# Patient Record
Sex: Male | Born: 1980 | Race: Black or African American | Hispanic: No | Marital: Single | Smoking: Never smoker
Health system: Southern US, Community
[De-identification: ages and names within clinical notes are randomized; demographics above are authoritative.]

## PROBLEM LIST (undated history)

## (undated) DIAGNOSIS — I10 Essential (primary) hypertension: Secondary | ICD-10-CM

## (undated) DIAGNOSIS — E119 Type 2 diabetes mellitus without complications: Secondary | ICD-10-CM

---

## 2016-03-14 ENCOUNTER — Ambulatory Visit (HOSPITAL_COMMUNITY)
Admission: EM | Admit: 2016-03-14 | Discharge: 2016-03-14 | Disposition: A | Discharge status: Home / Self Care | Attending: Family Medicine | Admitting: Family Medicine

## 2016-03-14 ENCOUNTER — Encounter (HOSPITAL_COMMUNITY): Payer: Self-pay | Admitting: Family Medicine

## 2016-03-14 DIAGNOSIS — K529 Noninfective gastroenteritis and colitis, unspecified: Secondary | ICD-10-CM

## 2016-03-14 HISTORY — DX: Type 2 diabetes mellitus without complications: E11.9

## 2016-03-14 HISTORY — DX: Essential (primary) hypertension: I10

## 2016-03-14 MED ORDER — ONDANSETRON 8 MG PO TBDP: 8.0000 mg | ORAL_TABLET | Freq: Three times a day (TID) | ORAL | 0 refills | Status: DC | PRN

## 2016-03-14 NOTE — ED Triage Notes (Signed)
Pt here for flu like symptoms.  

## 2016-03-14 NOTE — ED Provider Notes (Addendum)
[facility]    CSN: [id number] Arrival date & time: 03/14/16  1415     History   Chief Complaint Chief Complaint  Patient presents with  . Cough  . Emesis  . Diarrhea    HPI Alex Gamble is a 36 y.o. male.   This is a 36 year old noncompliant diabetic man who presents with 3-4 days of nausea, vomiting, diarrhea, and cough. He has not taken any medicine today.  Patient is supposed be taking metformin but has not been doing so and has not been checking his sugars. No polyuria, polyphagia. He just has generalized weakness and fatigue. His last vomiting episode was late morning and he did have one diarrhea episode this morning as well.  He works as an Musician. He seen today with his significant other.      Past Medical History:  Diagnosis Date  . Diabetes mellitus without complication (HCC)   . Hypertension     There are no active problems to display for this patient.   History reviewed. No pertinent surgical history.     Home Medications    Prior to Admission medications   Not on File    Family History History reviewed. No pertinent family history.  Social History Social History  Substance Use Topics  . Smoking status: Never Smoker  . Smokeless tobacco: Never Used  . Alcohol use Not on file     Allergies   Hepatitis b virus vaccine   Review of Systems Review of Systems  Constitutional: Positive for appetite change and fatigue. Negative for fever.  HENT: Negative for ear pain.   Eyes: Negative for pain.  Respiratory: Positive for cough.   Gastrointestinal: Positive for diarrhea, nausea and vomiting.  Musculoskeletal: Negative.   Neurological: Negative for dizziness.     Physical Exam Triage Vital Signs ED Triage Vitals  Enc Vitals Group     BP 03/14/16 1439 135/86     Pulse Rate 03/14/16 1439 91     Resp 03/14/16 1439 18     Temp 03/14/16 1439 98.4 F (36.9 C)     Temp src --      SpO2 03/14/16 1439 96 %   Weight --      Height --      Head Circumference --      Peak Flow --      Pain Score 03/14/16 1438 10     Pain Loc --      Pain Edu? --      Excl. in GC? --    No data found.   Updated Vital Signs BP 135/86   Pulse 91   Temp 98.4 F (36.9 C)   Resp 18   SpO2 96%    Physical Exam  Constitutional: He is oriented to person, place, and time. He appears well-developed and well-nourished.  HENT:  Head: Normocephalic.  Right Ear: External ear normal.  Left Ear: External ear normal.  Mouth/Throat: Oropharynx is clear and moist.  Eyes: Conjunctivae are normal. Pupils are equal, round, and reactive to light.  Neck: Normal range of motion. Neck supple.  Pulmonary/Chest: Effort normal.  Abdominal: Soft. Bowel sounds are normal.  Musculoskeletal: Normal range of motion.  Neurological: He is alert and oriented to person, place, and time.  Skin: Skin is warm and dry.  Nursing note and vitals reviewed.    UC Treatments / Results  Labs (all labs ordered are listed, but only abnormal results are displayed) Labs Reviewed - No data to  display  EKG  EKG Interpretation None       Radiology No results found.  Procedures Procedures (including critical care time)  Medications Ordered in UC Medications - No data to display   Initial Impression / Assessment and Plan / UC Course  I have reviewed the triage vital signs and the nursing notes.  Pertinent labs & imaging results that were available during my care of the patient were reviewed by me and considered in my medical decision making (see chart for details).     Final Clinical Impressions(s) / UC Diagnoses   Final diagnoses:  None    New Prescriptions New Prescriptions   No medications on file     Elvina SidleKurt Sophiagrace Benbrook, MD 03/14/16 1500    Elvina SidleKurt Montavis Schubring, MD 03/14/16 [phone]

## 2017-01-04 ENCOUNTER — Ambulatory Visit (HOSPITAL_COMMUNITY)
Admission: EM | Admit: 2017-01-04 | Discharge: 2017-01-04 | Disposition: A | Discharge status: Home / Self Care | Attending: Family Medicine | Admitting: Family Medicine

## 2017-01-04 ENCOUNTER — Encounter (HOSPITAL_COMMUNITY): Payer: Self-pay | Admitting: Emergency Medicine

## 2017-01-04 ENCOUNTER — Other Ambulatory Visit: Payer: Self-pay

## 2017-01-04 ENCOUNTER — Ambulatory Visit (INDEPENDENT_AMBULATORY_CARE_PROVIDER_SITE_OTHER)

## 2017-01-04 DIAGNOSIS — R0789 Other chest pain: Secondary | ICD-10-CM

## 2017-01-04 MED ORDER — PREDNISONE 20 MG PO TABS: ORAL_TABLET | ORAL | 0 refills | Status: AC

## 2017-01-04 NOTE — ED Triage Notes (Signed)
Pt c/o CP middle of his chest down to the right, pain is reproducible with movement. Tender to palpation. Denies injury.

## 2017-01-04 NOTE — ED Provider Notes (Signed)
[facility]   [id number] 01/04/17 Arrival Time: 1001   SUBJECTIVE:  Alex Gamble is a 36 y.o. male who presents to the urgent care with complaint of right chest pain, spontaneous in onset, sharp and intense in quality. This began yesterday around 4:00 when patient was just sitting down. He went to the emergency room last night but after waiting several hours, went home and took an ibuprofen. He was able to sleep in a fixed position, but anytime he moves his right arm or takes a deep breath or sneezes, he has excruciating right chest pain in the pectoral region along with tingling in his right arm.  Patient's in active military service but has not done any significant physical activity since last Monday when he played dodgeball. He took a trip to [place] last weekend but he's had no swelling or pain in his legs.  Patient denies any trauma or fever or shortness of breath.     Past Medical History:  Diagnosis Date  . Diabetes mellitus without complication (HCC)   . Hypertension    No family history on file. Social History   Socioeconomic History  . Marital status: Single    Spouse name: Not on file  . Number of children: Not on file  . Years of education: Not on file  . Highest education level: Not on file  Social Needs  . Financial resource strain: Not on file  . Food insecurity - worry: Not on file  . Food insecurity - inability: Not on file  . Transportation needs - medical: Not on file  . Transportation needs - non-medical: Not on file  Occupational History  . Not on file  Tobacco Use  . Smoking status: Never Smoker  . Smokeless tobacco: Never Used  Substance and Sexual Activity  . Alcohol use: Not on file  . Drug use: Not on file  . Sexual activity: Not on file  Other Topics Concern  . Not on file  Social History Narrative  . Not on file   Current Meds  Medication Sig  . HYDROCHLOROTHIAZIDE PO Take by mouth.  . METFORMIN HCL PO Take by mouth.    Allergies  Allergen Reactions  . Hepatitis B Virus Vaccine       ROS: As per HPI, remainder of ROS negative.   OBJECTIVE:   Vitals:   01/04/17 1009  BP: (!) 143/90  Pulse: 73  Resp: 18  Temp: 98.2 F (36.8 C)  SpO2: 100%     General appearance: alert; no distress Eyes: PERRL; EOMI; conjunctiva normal HENT: normocephalic; atraumatic;  external ears normal without trauma; nasal mucosa normal; oral mucosa normal Neck: supple Lungs: clear to auscultation bilaterally, very tender right anterior chest along the T4 distribution.  No pain with light touch to the skin and no rash. Heart: regular rate and rhythm Abdomen: soft, non-tender; bowel sounds normal; no masses or organomegaly; no guarding or rebound tenderness Back: no CVA tenderness Extremities: no cyanosis or edema; symmetrical with no gross deformities, no tenderness in the legs Skin: warm and dry, no swelling or ecchymosis Neurologic: normal gait; grossly normal Psychological: alert and cooperative; normal mood and affect  CXR:  No acute findings   ASSESSMENT & PLAN:  1. Chest wall pain     Meds ordered this encounter  Medications  . predniSONE (DELTASONE) 20 MG tablet    Sig: Two daily with food    Dispense:  10 tablet    Refill:  0    Reviewed  expectations re: course of current medical issues. Questions answered. Outlined signs and symptoms indicating need for more acute intervention. Patient verbalized understanding. After Visit Summary given.       Elvina SidleLauenstein, Barbette Mcglaun, MD 01/04/17 1047

## 2017-01-04 NOTE — Discharge Instructions (Signed)
This appears to be nerve irritation underneath the rib that may have occurred when you felt a pop.  Take the prednisone and if you're not improving over the emergency room for CAT scan.

## 2017-05-12 ENCOUNTER — Encounter (HOSPITAL_COMMUNITY): Payer: Self-pay

## 2017-05-12 ENCOUNTER — Emergency Department (HOSPITAL_COMMUNITY)

## 2017-05-12 ENCOUNTER — Emergency Department (HOSPITAL_COMMUNITY)
Admission: EM | Admit: 2017-05-12 | Discharge: 2017-05-12 | Disposition: A | Discharge status: Home / Self Care | Attending: Emergency Medicine | Admitting: Emergency Medicine

## 2017-05-12 DIAGNOSIS — I10 Essential (primary) hypertension: Secondary | ICD-10-CM | POA: Insufficient documentation

## 2017-05-12 DIAGNOSIS — R0789 Other chest pain: Secondary | ICD-10-CM | POA: Insufficient documentation

## 2017-05-12 DIAGNOSIS — Z7984 Long term (current) use of oral hypoglycemic drugs: Secondary | ICD-10-CM | POA: Diagnosis not present

## 2017-05-12 DIAGNOSIS — E1152 Type 2 diabetes mellitus with diabetic peripheral angiopathy with gangrene: Secondary | ICD-10-CM | POA: Insufficient documentation

## 2017-05-12 DIAGNOSIS — R202 Paresthesia of skin: Secondary | ICD-10-CM | POA: Insufficient documentation

## 2017-05-12 DIAGNOSIS — Z79899 Other long term (current) drug therapy: Secondary | ICD-10-CM | POA: Insufficient documentation

## 2017-05-12 LAB — CBC
HEMATOCRIT: 46.5 % (ref 39.0–52.0)
HEMOGLOBIN: 14.5 g/dL (ref 13.0–17.0)
MCH: 23.4 pg — ABNORMAL LOW (ref 26.0–34.0)
MCHC: 31.2 g/dL (ref 30.0–36.0)
MCV: 75 fL — AB (ref 78.0–100.0)
Platelets: 226 10*3/uL (ref 150–400)
RBC: 6.2 MIL/uL — AB (ref 4.22–5.81)
RDW: 14.8 % (ref 11.5–15.5)
WBC: 5 10*3/uL (ref 4.0–10.5)

## 2017-05-12 LAB — BASIC METABOLIC PANEL WITH GFR
Anion gap: 12 (ref 5–15)
BUN: 9 mg/dL (ref 6–20)
CO2: 24 mmol/L (ref 22–32)
Calcium: 10.1 mg/dL (ref 8.9–10.3)
Chloride: 106 mmol/L (ref 101–111)
Creatinine, Ser: 0.91 mg/dL (ref 0.61–1.24)
GFR calc Af Amer: >60 mL/min
GFR calc non Af Amer: >60 mL/min
Glucose, Bld: 101 mg/dL — ABNORMAL HIGH (ref 65–99)
Potassium: 3.8 mmol/L (ref 3.5–5.1)
Sodium: 142 mmol/L (ref 135–145)

## 2017-05-12 LAB — I-STAT TROPONIN, ED
Troponin i, poc: 0 ng/mL (ref 0.00–0.08)
Troponin i, poc: 0 ng/mL (ref 0.00–0.08)

## 2017-05-12 LAB — URINALYSIS, ROUTINE W REFLEX MICROSCOPIC
Bilirubin Urine: NEGATIVE
Glucose, UA: NEGATIVE mg/dL
Hgb urine dipstick: NEGATIVE
Ketones, ur: NEGATIVE mg/dL
Leukocytes, UA: NEGATIVE
Nitrite: NEGATIVE
Protein, ur: NEGATIVE mg/dL
Specific Gravity, Urine: 1.008 (ref 1.005–1.030)
pH: 6 (ref 5.0–8.0)

## 2017-05-12 LAB — CK: CK TOTAL: 164 U/L (ref 49–397)

## 2017-05-12 MED ORDER — LIDOCAINE 5 % EX PTCH: 1.0000 | MEDICATED_PATCH | CUTANEOUS | 0 refills | Status: AC

## 2017-05-12 MED ORDER — METHOCARBAMOL 500 MG PO TABS
1000.0000 mg | ORAL_TABLET | Freq: Once | ORAL | Status: AC
Start: 2017-05-12 — End: 2017-05-12
  Administered 2017-05-12: 1000 mg via ORAL
  Filled 2017-05-12: qty 2

## 2017-05-12 MED ORDER — IBUPROFEN 400 MG PO TABS
600.0000 mg | ORAL_TABLET | Freq: Once | ORAL | Status: AC
  Administered 2017-05-12: 14:00:00 600 mg via ORAL
  Filled 2017-05-12: qty 1

## 2017-05-12 MED ORDER — METHOCARBAMOL 500 MG PO TABS: 500.0000 mg | ORAL_TABLET | Freq: Two times a day (BID) | ORAL | 0 refills | Status: AC

## 2017-05-12 NOTE — ED Notes (Signed)
Pt verbalized understanding of all d/c instructions, prescriptions, and f/u information. Opportunity for questioning and answers provided. VSS. All belongings with patient at this time. Pt ambulatory to lobby with steady gait.   

## 2017-05-12 NOTE — ED Notes (Addendum)
Pt ambulatory to restroom with steady gait for urine specimen.

## 2017-05-12 NOTE — Discharge Instructions (Addendum)
Take it easy, but do not lay around too much as this may make any stiffness worse.  Antiinflammatory medications: Take 600 mg of ibuprofen every 6 hours or 440 mg (over the counter dose) to 500 mg (prescription dose) of naproxen every 12 hours for the next 3 days. After this time, these medications may be used as needed for pain. Take these medications with food to avoid upset stomach. Choose only one of these medications, do not take them together. Do not take these medications in combination with the meloxicam. Tylenol: Should you continue to have additional pain while taking the ibuprofen or naproxen, you may add in tylenol as needed. Your daily total maximum amount of tylenol from all sources should be limited to 4000mg /day for persons without liver problems, or 2000mg /day for those with liver problems. Muscle relaxer: Robaxin is a muscle relaxer and may help loosen stiff muscles. Do not take the Robaxin while driving or performing other dangerous activities.  Lidocaine patches: These are available via either prescription or over-the-counter. The over-the-counter option may be more economical one and are likely just as effective. There are multiple over-the-counter brands, such as Salonpas. Exercises: Be sure to perform the attached exercises starting with three times a week and working up to performing them daily. This is an essential part of preventing long term problems.   Follow up with a primary care provider for any future management of these complaints. Return to the ED for worsening pain, shortness of breath, abnormal sweating, persistent vomiting, or any other major concerns.

## 2017-05-12 NOTE — ED Provider Notes (Signed)
[facility] EMERGENCY DEPARTMENT Provider Note   CSN: [id number] Arrival date & time: 05/12/17  1054     History   Chief Complaint No chief complaint on file.   HPI Alex Gamble. is a 37 y.o. male.  HPI   Alex Gamble. is a 37 y.o. male, with a history of DM and HTN, presenting to the ED with chest pain that came on around 9AM this morning while walking down the stairs.  Pain is in the left chest, described as a soreness, currently 6/10, worse with palpation and movement of the left shoulder, nonradiating. States he has not felt this pain before.  Accompanied by tingling in left arm and hand.  States he thought he was dehydrated so he quickly drank 4 bottles of water and then threw up the water.  He recently increased his workouts from once a day to twice a day over the last week.  Denies any drug use or supplements. Denies cough, shortness of breath, fever/chills, current nausea, diarrhea, abdominal pain, back pain, weakness, diaphoresis, peripheral edema, trauma, or any other complaints.   Past Medical History:  Diagnosis Date  . Diabetes mellitus without complication (HCC)   . Hypertension     There are no active problems to display for this patient.   History reviewed. No pertinent surgical history.      Home Medications    Prior to Admission medications   Medication Sig Start Date End Date Taking? Authorizing Provider  meloxicam (MOBIC) 7.5 MG tablet Take 7.5 mg by mouth daily.   Yes [provider]  rosuvastatin (CRESTOR) 10 MG tablet Take 10 mg by mouth daily.   Yes [provider]  sertraline (ZOLOFT) 50 MG tablet Take 50 mg by mouth daily.   Yes [provider]  traZODone (DESYREL) 50 MG tablet Take 50 mg by mouth at bedtime.   Yes [provider]  HYDROCHLOROTHIAZIDE PO Take 25 mg by mouth daily.     [provider]  lidocaine (LIDODERM) 5 % Place 1 patch onto the skin daily. Remove &  Discard patch within 12 hours or as directed by MD 05/12/17   Angline Schweigert C, PA-C  METFORMIN HCL PO Take by mouth.    [provider]  methocarbamol (ROBAXIN) 500 MG tablet Take 1 tablet (500 mg total) by mouth 2 (two) times daily. 05/12/17   Manuela Halbur, Hillard Danker, PA-C  predniSONE (DELTASONE) 20 MG tablet Two daily with food 01/04/17   Elvina Sidle, MD    Family History Family History  Problem Relation Age of Onset  . Stroke Mother 34  . Hypertension Mother   . Diabetes Mother     Social History Social History   Tobacco Use  . Smoking status: Never Smoker  . Smokeless tobacco: Never Used  Substance Use Topics  . Alcohol use: Not on file  . Drug use: Not on file     Allergies   Hepatitis b virus vaccine   Review of Systems Review of Systems  Constitutional: Negative for chills, diaphoresis and fever.  Respiratory: Negative for cough and shortness of breath.   Cardiovascular: Positive for chest pain. Negative for leg swelling.  Gastrointestinal: Negative for abdominal pain, constipation, diarrhea, nausea and vomiting.  All other systems reviewed and are negative.    Physical Exam Updated Vital Signs BP (!) 147/105   Pulse (!) 54   Temp 97.6 F (36.4 C) (Oral)   Resp 19   Ht 6'  3" (1.905 m)   Wt 116.1 kg (256 lb)   SpO2 99%   BMI 32.00 kg/m   Physical Exam  Constitutional: He appears well-developed and well-nourished. No distress.  HENT:  Head: Normocephalic and atraumatic.  Eyes: Conjunctivae are normal.  Neck: Normal range of motion. Neck supple.  Cardiovascular: Normal rate, regular rhythm, normal heart sounds and intact distal pulses.  Pulses:      Radial pulses are 2+ on the right side, and 2+ on the left side.  Pulmonary/Chest: Effort normal and breath sounds normal. No respiratory distress. He exhibits tenderness.  No increased work of breathing.  Speaks in full sentences without difficulty.  Tenderness is noted with even light palpation.  No noted  bruising, swelling, erythema, crepitus, or deformity noted.    Abdominal: Soft. There is no tenderness. There is no guarding.  Musculoskeletal: He exhibits no edema.  Exacerbation of the patient's pain with range of motion in the left shoulder.  Range of motion intact.  No tenderness, swelling, laxity, deformity, or other abnormality noted to the left shoulder.  Lymphadenopathy:    He has no cervical adenopathy.  Neurological: He is alert.  No sensory deficits noted in the bilateral upper extremities. Abduction and adduction of the fingers intact against resistance. Grip strength equal bilaterally. Supination and pronation intact against resistance. Strength 5/5 through the cardinal directions of the bilateral wrists. Strength 5/5 with flexion and extension of the bilateral elbows. Patient can touch the thumb to each one of the fingertips without difficulty.    Skin: Skin is warm and dry. He is not diaphoretic.  Psychiatric: He has a normal mood and affect. His behavior is normal.  Nursing note and vitals reviewed.    ED Treatments / Results  Labs (all labs ordered are listed, but only abnormal results are displayed) Labs Reviewed  BASIC METABOLIC PANEL - Abnormal; Notable for the following components:      Result Value   Glucose, Bld 101 (*)    All other components within normal limits  CBC - Abnormal; Notable for the following components:   RBC 6.20 (*)    MCV 75.0 (*)    MCH 23.4 (*)    All other components within normal limits  URINALYSIS, ROUTINE W REFLEX MICROSCOPIC - Abnormal; Notable for the following components:   Color, Urine STRAW (*)    All other components within normal limits  CK  MYOGLOBIN, URINE  I-STAT TROPONIN, ED  I-STAT TROPONIN, ED    EKG EKG Interpretation  Date/Time:  Sunday May 12 2017 11:05:52 EDT Ventricular Rate:  76 PR Interval:  184 QRS Duration: 92 QT Interval:  380 QTC Calculation: 427 R Axis:   47 Text Interpretation:  Normal  sinus rhythm Normal ECG Confirmed by Tilden Fossaees, Elizabeth [phone]([id number]) on 05/12/2017 3:41:26 PM    Radiology Dg Chest 2 View  Result Date: 05/12/2017 CLINICAL DATA:  Pt c/o CP middle of his chest down to the right, pain is reproducible with movement. Tender to palpation. Denies injury. Pt states pain started this AM. Pt has hx of high bp and diabetes but does not take meds for either. EXAM: CHEST - 2 VIEW COMPARISON:  01/04/2017 FINDINGS: The heart size and mediastinal contours are within normal limits. Both lungs are clear. No pleural effusion or pneumothorax. The visualized skeletal structures are unremarkable. IMPRESSION: Normal chest radiographs. Electronically Signed   By: Amie Portlandavid  Ormond M.D.   On: 05/12/2017 11:50    Procedures Procedures (including critical care time)  Medications  Ordered in ED Medications  ibuprofen (ADVIL,MOTRIN) tablet 600 mg (600 mg Oral Given 05/12/17 1401)  methocarbamol (ROBAXIN) tablet 1,000 mg (1,000 mg Oral Given 05/12/17 1401)     Initial Impression / Assessment and Plan / ED Course  I have reviewed the triage vital signs and the nursing notes.  Pertinent labs & imaging results that were available during my care of the patient were reviewed by me and considered in my medical decision making (see chart for details).  Clinical Course as of May 12 1600  Wynelle Link May 12, 2017  1524 Patient states his pain has improved and is now minor.    [SJ]  1600 Rhia from main lab states this is a send out test to [facility] and results won't return today.  Myoglobin, urine [SJ]    Clinical Course User Index [SJ] Shaylin Blatt C, PA-C    Patient presents with chest pain that is atypical sounding. Low suspicion for ACS. HEART score is 1, indicating low risk for a cardiac event. Wells criteria score is 0, indicating low risk for PE. PERC negative.  Pain is reproducible on exam.  Delta troponins negative. Due to the change in the patient's workout schedule and increase in intensity,  rhabdomyolysis is a consideration, though thought to be lower on the differential. CK and creatinine normal. Urine myoglobin pending.  PCP follow up. Resources given. The patient was given instructions for home care as well as return precautions. Patient voices understanding of these instructions, accepts the plan, and is comfortable with discharge.   Vitals:   05/12/17 1300 05/12/17 1315 05/12/17 1345 05/12/17 1430  BP: (!) 145/99 (!) 139/93 (!) 140/92 (!) 139/92  Pulse: 61 61  61  Resp: (!) 21 (!) 21 16 17   Temp:      TempSrc:      SpO2: 100% 100%  99%  Weight:      Height:          Final Clinical Impressions(s) / ED Diagnoses   Final diagnoses:  Atypical chest pain    ED Discharge Orders        Ordered    methocarbamol (ROBAXIN) 500 MG tablet  2 times daily     05/12/17 1527    lidocaine (LIDODERM) 5 %  Every 24 hours     05/12/17 1527       Anselm Pancoast, PA-C 05/12/17 1618    Tilden Fossa, MD 05/12/17 1701

## 2017-05-14 LAB — MYOGLOBIN, URINE: Myoglobin, Ur: 2 ng/mL (ref 0–13)

## 2017-09-13 ENCOUNTER — Ambulatory Visit (HOSPITAL_COMMUNITY)
Admission: EM | Admit: 2017-09-13 | Discharge: 2017-09-13 | Disposition: A | Discharge status: Home / Self Care | Attending: Family Medicine | Admitting: Family Medicine

## 2017-09-13 ENCOUNTER — Other Ambulatory Visit: Payer: Self-pay

## 2017-09-13 ENCOUNTER — Encounter (HOSPITAL_COMMUNITY): Payer: Self-pay

## 2017-09-13 DIAGNOSIS — J069 Acute upper respiratory infection, unspecified: Secondary | ICD-10-CM | POA: Diagnosis not present

## 2017-09-13 MED ORDER — AZITHROMYCIN 250 MG PO TABS: ORAL_TABLET | ORAL | 0 refills | Status: AC

## 2017-09-13 MED ORDER — CETIRIZINE HCL 5 MG PO TABS: 5.0000 mg | ORAL_TABLET | Freq: Every day | ORAL | 1 refills | Status: AC

## 2017-09-13 MED ORDER — GUAIFENESIN 100 MG/5ML PO LIQD: 100.0000 mg | ORAL | 0 refills | Status: AC | PRN

## 2017-09-13 MED ORDER — FLUTICASONE PROPIONATE 50 MCG/ACT NA SUSP: 1.0000 | Freq: Every day | NASAL | 2 refills | Status: AC

## 2017-09-13 NOTE — ED Provider Notes (Signed)
[facility]    CSN: [id number] Arrival date & time: 09/13/17  [id number]     History   Chief Complaint Chief Complaint  Patient presents with  . Facial Pain    HPI Alex B Thomes Cakeillman Jr. is a 37 y.o. male.   She is a healthy 37 year old male that presents with 5 days of nasal congestion, drainage, cough.  This is been worsening since Monday.  He has had significant facial pressure with copious drainage.  He has been using Mucinex for his symptoms.  He denies any fever, chills, body aches.  He denies any chest pain, shortness of breath he reports that he is traveling out of town this weekend and is worried that if he gets worse he will not be able to get to a doctor.  ROS per HPI      Past Medical History:  Diagnosis Date  . Diabetes mellitus without complication (HCC)   . Hypertension     There are no active problems to display for this patient.   History reviewed. No pertinent surgical history.     Home Medications    Prior to Admission medications   Medication Sig Start Date End Date Taking? Authorizing Provider  azithromycin (ZITHROMAX Z-PAK) 250 MG tablet Per z pac 09/13/17   Jerimiah Wolman A, NP  cetirizine (ZYRTEC) 5 MG tablet Take 1 tablet (5 mg total) by mouth daily. 09/13/17   Jamez Ambrocio, Gloris Manchesterraci A, NP  fluticasone (FLONASE) 50 MCG/ACT nasal spray Place 1 spray into both nostrils daily. 09/13/17   Dahlia ByesBast, Revere Maahs A, NP  guaiFENesin (ROBITUSSIN) 100 MG/5ML liquid Take 5-10 mLs (100-200 mg total) by mouth every 4 (four) hours as needed for cough. 09/13/17   Dahlia ByesBast, Brihana Quickel A, NP  HYDROCHLOROTHIAZIDE PO Take 25 mg by mouth daily.     [provider]  lidocaine (LIDODERM) 5 % Place 1 patch onto the skin daily. Remove & Discard patch within 12 hours or as directed by MD 05/12/17   Joy, Shawn C, PA-C  meloxicam (MOBIC) 7.5 MG tablet Take 7.5 mg by mouth daily.    [provider]  METFORMIN HCL PO Take by mouth.    [provider]  methocarbamol (ROBAXIN) 500  MG tablet Take 1 tablet (500 mg total) by mouth 2 (two) times daily. 05/12/17   Joy, Shawn C, PA-C  predniSONE (DELTASONE) 20 MG tablet Two daily with food 01/04/17   Elvina SidleLauenstein, Kurt, MD  rosuvastatin (CRESTOR) 10 MG tablet Take 10 mg by mouth daily.    [provider]  sertraline (ZOLOFT) 50 MG tablet Take 50 mg by mouth daily.    [provider]  traZODone (DESYREL) 50 MG tablet Take 50 mg by mouth at bedtime.    [provider]    Family History Family History  Problem Relation Age of Onset  . Stroke Mother 5838  . Hypertension Mother   . Diabetes Mother     Social History Social History   Tobacco Use  . Smoking status: Never Smoker  . Smokeless tobacco: Never Used  Substance Use Topics  . Alcohol use: Not on file  . Drug use: Not on file     Allergies   Hepatitis b virus vaccine   Review of Systems Review of Systems   Physical Exam Triage Vital Signs ED Triage Vitals  Enc Vitals Group     BP 09/13/17 0817 (!) 145/103     Pulse Rate 09/13/17 0817 98     Resp 09/13/17 0817  18     Temp 09/13/17 0817 98.4 F (36.9 C)     Temp src --      SpO2 --      Weight 09/13/17 0818 256 lb (116.1 kg)     Height --      Head Circumference --      Peak Flow --      Pain Score 09/13/17 0818 8     Pain Loc --      Pain Edu? --      Excl. in GC? --    No data found.  Updated Vital Signs BP (!) 145/103   Pulse 98   Temp 98.4 F (36.9 C)   Resp 18   Wt 256 lb (116.1 kg)   BMI 32.00 kg/m   Visual Acuity Right Eye Distance:   Left Eye Distance:   Bilateral Distance:    Right Eye Near:   Left Eye Near:    Bilateral Near:     Physical Exam  Constitutional: He is oriented to person, place, and time. He appears well-developed and well-nourished.  HENT:  Head: Normocephalic and atraumatic.  Bilateral swollen nasal turbinates. Copious drainage in the back of throat.  Lateral TMs normal. No Tonsillar swelling or exudates.   Neck: Normal  range of motion.  Cardiovascular: Normal rate and regular rhythm.  Pulmonary/Chest: Effort normal and breath sounds normal.  Lungs clear no adventitious breath sounds heard  Musculoskeletal: Normal range of motion.  Lymphadenopathy:    He has no cervical adenopathy.  Neurological: He is alert and oriented to person, place, and time.  Skin: Skin is warm and dry. Capillary refill takes less than 2 seconds.  Psychiatric: He has a normal mood and affect.  Nursing note and vitals reviewed.    UC Treatments / Results  Labs (all labs ordered are listed, but only abnormal results are displayed) Labs Reviewed - No data to display  EKG None  Radiology No results found.  Procedures Procedures (including critical care time)  Medications Ordered in UC Medications - No data to display  Initial Impression / Assessment and Plan / UC Course  I have reviewed the triage vital signs and the nursing notes.  Pertinent labs & imaging results that were available during my care of the patient were reviewed by me and considered in my medical decision making (see chart for details).    Most likely viral URI Symptomatic treatment with flonase, zyrtec and mucinex.   Pt is traveling to [place] this weekend for [employer] duty and provided him with a z pac if his symptoms worsen or do not improve.  Final Clinical Impressions(s) / UC Diagnoses   Final diagnoses:  Acute upper respiratory infection     Discharge Instructions      It was nice meeting you!!  I believe you have a viral upper respiratory infection. It could take 7 to 10 days for the symptoms to decrease or improve.  Zyrtec and mucinex for the drainage in your throat and congestion.  Flonase for nasal congestion.  Ibuprofen and tylenol can help with the pain.  I will give you a z pac since you are traveling and you can fill this if your symptoms don't improve.      ED Prescriptions    Medication Sig Dispense Auth. Provider    azithromycin (ZITHROMAX Z-PAK) 250 MG tablet Per z pac 1 each Etoy Mcdonnell A, NP   guaiFENesin (ROBITUSSIN) 100 MG/5ML liquid Take 5-10 mLs (100-200 mg total) by  mouth every 4 (four) hours as needed for cough. 60 mL Hali Balgobin A, NP   fluticasone (FLONASE) 50 MCG/ACT nasal spray Place 1 spray into both nostrils daily. 16 g Hindy Perrault A, NP   cetirizine (ZYRTEC) 5 MG tablet Take 1 tablet (5 mg total) by mouth daily. 30 tablet Dahlia Byes A, NP     Controlled Substance Prescriptions [place] Controlled Substance Registry consulted? Not Applicable   Janace Aris, NP 09/13/17 [phone]

## 2017-09-13 NOTE — Discharge Instructions (Signed)
°  It was nice meeting you!!  I believe you have a viral upper respiratory infection. It could take 7 to 10 days for the symptoms to decrease or improve.  Zyrtec and mucinex for the drainage in your throat and congestion.  Flonase for nasal congestion.  Ibuprofen and tylenol can help with the pain.  I will give you a z pac since you are traveling and you can fill this if your symptoms don't improve.

## 2017-09-13 NOTE — ED Triage Notes (Signed)
4 days sinus pressure and facial pain.

## 2018-02-12 ENCOUNTER — Ambulatory Visit (INDEPENDENT_AMBULATORY_CARE_PROVIDER_SITE_OTHER): Admitting: Mental Health

## 2018-02-12 ENCOUNTER — Encounter: Payer: Self-pay | Admitting: Mental Health

## 2018-02-12 DIAGNOSIS — F411 Generalized anxiety disorder: Secondary | ICD-10-CM | POA: Diagnosis not present

## 2018-02-12 DIAGNOSIS — F4310 Post-traumatic stress disorder, unspecified: Secondary | ICD-10-CM

## 2018-02-12 NOTE — Progress Notes (Signed)
[facility] Counselor Initial Adult Exam- Part I  Name: Alex ChalkSemar B Cressy Jr. Date: 02/12/2018 MRN: [id number] DOB: 01/21/1981 PCP: Alex Gamble, Alex W, MD  Time spent: 45 minutes   Guardian/Payee: patient  Paperwork requested:  No   Reason for Visit /Presenting Problem: Stress, PTSD, anxiety, insomnia  Mental Status Exam:   Appearance:   Neat     Behavior:  Appropriate and Sharing  Motor:  Normal  Speech/Language:   Clear and Coherent  Affect:  Appropriate  Mood:  normal  Thought process:  normal  Thought content:    WNL  Sensory/Perceptual disturbances:    WNL  Orientation:  oriented to person, place and time/date  Attention:  Good  Concentration:  Good  Memory:  WNL  Fund of knowledge:   Good  Insight:    Good  Judgment:   Good  Impulse Control:  Good   Reported Symptoms:  Fatigue and PTSD triggers  Risk Assessment: Danger to Self:  No Self-injurious Behavior: No Danger to Others: No Duty to Warn:no Physical Aggression / Violence:No  Access to Firearms a concern: No  Gang Involvement:No  Patient / guardian was educated about steps to take if suicide or homicide risk level increases between visits: n/a While future psychiatric events cannot be accurately predicted, the patient does not currently require acute inpatient psychiatric care and does not currently meet [facility] involuntary commitment criteria.  Substance Abuse History: Current substance abuse: No     Past Psychiatric History:   Previous psychological history is significant for OTSD - military Outpatient Providers:Tisovic History of Psych Hospitalization: No  Psychological Testing: unknown    Medical History/Surgical History:reviewed Past Medical History:  Diagnosis Date  . Diabetes mellitus without complication (HCC)   . Hypertension     No past surgical history on file.  Medications: Current Outpatient Medications  Medication Sig Dispense Refill  . azithromycin (ZITHROMAX Z-PAK) 250  MG tablet Per z pac 1 each 0  . cetirizine (ZYRTEC) 5 MG tablet Take 1 tablet (5 mg total) by mouth daily. 30 tablet 1  . fluticasone (FLONASE) 50 MCG/ACT nasal spray Place 1 spray into both nostrils daily. 16 g 2  . guaiFENesin (ROBITUSSIN) 100 MG/5ML liquid Take 5-10 mLs (100-200 mg total) by mouth every 4 (four) hours as needed for cough. 60 mL 0  . HYDROCHLOROTHIAZIDE PO Take 25 mg by mouth daily.     Marland Kitchen. lidocaine (LIDODERM) 5 % Place 1 patch onto the skin daily. Remove & Discard patch within 12 hours or as directed by MD 30 patch 0  . meloxicam (MOBIC) 7.5 MG tablet Take 7.5 mg by mouth daily.    Marland Kitchen. METFORMIN HCL PO Take by mouth.    . methocarbamol (ROBAXIN) 500 MG tablet Take 1 tablet (500 mg total) by mouth 2 (two) times daily. 20 tablet 0  . predniSONE (DELTASONE) 20 MG tablet Two daily with food 10 tablet 0  . rosuvastatin (CRESTOR) 10 MG tablet Take 10 mg by mouth daily.    . sertraline (ZOLOFT) 50 MG tablet Take 50 mg by mouth daily.    . traZODone (DESYREL) 50 MG tablet Take 50 mg by mouth at bedtime.     No current facility-administered medications for this visit.     Allergies  Allergen Reactions  . Hepatitis B Virus Vaccine     Diagnoses:    ICD-10-CM   1. PTSD (post-traumatic stress disorder) F43.10   2. Generalized anxiety disorder F41.1      Part II to  be continued at next session:   No.           Abuse History: Victim: none Report needed: no Perpetrator of abuse: no Witness / Exposure to Domestic Violence:  none Protective Services Involvement: no Witness to [employer] Violence:  no   Family / Social History:    Living situation:  Lives n townhouse alone but with girlfriend when she is in town Sexual Orientation: straight Relationship Status:   Divorced, current significant other Name of spouse / other: Doctor, general practice If a parent, number of children / ages:   [name], [name]  Support Systems: Hotel manager, family  Financial Stress:    routine  Income/Employment/Disability:   In [employer] 19 years, retiring in July  Military Service: Electronics engineer, 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

## 2018-02-25 ENCOUNTER — Ambulatory Visit (INDEPENDENT_AMBULATORY_CARE_PROVIDER_SITE_OTHER): Admitting: Psychiatry

## 2018-02-25 DIAGNOSIS — F411 Generalized anxiety disorder: Secondary | ICD-10-CM | POA: Diagnosis not present

## 2018-02-25 DIAGNOSIS — F0630 Mood disorder due to known physiological condition, unspecified: Secondary | ICD-10-CM | POA: Diagnosis not present

## 2018-02-25 DIAGNOSIS — F4310 Post-traumatic stress disorder, unspecified: Secondary | ICD-10-CM

## 2018-02-25 MED ORDER — DOXAZOSIN MESYLATE 1 MG PO TABS: ORAL_TABLET | ORAL | 1 refills | Status: DC

## 2018-02-25 NOTE — Progress Notes (Signed)
[facility] MD/PA/NP Initial Note  02/25/2018 1:04 PM Alex Gamble.  MRN:  [id number]  Chief Complaint: PTSD  HPI: 38 year old male patient.  Referred by Ulice Bold.  Treatment for PTSD.  He has done 3 tours in [place].  Said friends dying, marital issues, patient moving every 3 years are stressors.  He feels PTSD started around 2003, he got help in 2010.  He has nightmares 2-3 a week, no flashbacks. He does have occasional bouts of depression which last a week.  He feels hopeless and confused.  Eats okay.  Works out.  Decreased sleep.  No suicidal thoughts.  Most recent was 3 weeks ago. He can be impulsive in good or bad moods he is not grandiose he does talk more and does have decreased sleep and he is goal oriented.  Last a week to a month.  He is typically in a good mood.  MDQ was +3 with a moderate problem.  Patient says he is a moody person.  He used to ride motorcycles in the past. Anxiety since 2014 particularly crowds and the unknown.  Noticed sweating heart racing and chest pain. OCD is asked obsessive thoughts  Visit Diagnosis: PTSD, anxiety, mood disorder.  Past Psychiatric History: PtSD, anxiety No hospitalizations Review of information sent to [facility] who is his former Veterinary surgeon.  Currently he is seeing Ulice Bold from our office. Meds-trazadone, zoloft No personal abuse  Past Medical History: hypertension, DM, elevated cholest. Fx jaw. Past Medical History:  Diagnosis Date  . Diabetes mellitus without complication (HCC)   . Hypertension    No past surgical history on file.  Family Psychiatric History: negative  Family History:  Family History  Problem Relation Age of Onset  . Stroke Mother 82  . Hypertension Mother   . Diabetes Mother     Social History:  Corporate investment banker for [employer].  Single 1 daughter-Semiah Christian. No legal hx.  Caffeine-250 mg/day Smoking no ETOH-2-3 drinks a week. Drugs-no Past etoh-1/5 etoh week.  2014-2016 Drugs-neg.   Social History   Socioeconomic History  . Marital status: Single    Spouse name: Not on file  . Number of children: Not on file  . Years of education: Not on file  . Highest education level: Not on file  Occupational History  . Not on file  Social Needs  . Financial resource strain: Not on file  . Food insecurity:    Worry: Not on file    Inability: Not on file  . Transportation needs:    Medical: Not on file    Non-medical: Not on file  Tobacco Use  . Smoking status: Never Smoker  . Smokeless tobacco: Never Used  Substance and Sexual Activity  . Alcohol use: Not on file    Comment: social drinker, mostly beer  . Drug use: Never  . Sexual activity: Yes    Partners: Female  Lifestyle  . Physical activity:    Days per week: Not on file    Minutes per session: Not on file  . Stress: Not on file  Relationships  . Social connections:    Talks on phone: Not on file    Gets together: Not on file    Attends religious service: Not on file    Active member of club or organization: Not on file    Attends meetings of clubs or organizations: Not on file    Relationship status: Not on file  Other Topics Concern  . Not on file  Social  History Narrative  . Not on file    Allergies:  Allergies  Allergen Reactions  . Hepatitis B Virus Vaccine     Metabolic Disorder Labs: No results found for: HGBA1C, MPG No results found for: PROLACTIN No results found for: CHOL, TRIG, HDL, CHOLHDL, VLDL, LDLCALC No results found for: TSH  Therapeutic Level Labs: No results found for: LITHIUM No results found for: VALPROATE No components found for:  CBMZ  Current Medications: also sildenafil, atorvastatin, Soma, Current Outpatient Medications  Medication Sig Dispense Refill  . azithromycin (ZITHROMAX Z-PAK) 250 MG tablet Per z pac 1 each 0  . cetirizine (ZYRTEC) 5 MG tablet Take 1 tablet (5 mg total) by mouth daily. 30 tablet 1  . fluticasone (FLONASE) 50  MCG/ACT nasal spray Place 1 spray into both nostrils daily. 16 g 2  . guaiFENesin (ROBITUSSIN) 100 MG/5ML liquid Take 5-10 mLs (100-200 mg total) by mouth every 4 (four) hours as needed for cough. 60 mL 0  . HYDROCHLOROTHIAZIDE PO Take 25 mg by mouth daily.     Marland Kitchen. lidocaine (LIDODERM) 5 % Place 1 patch onto the skin daily. Remove & Discard patch within 12 hours or as directed by MD 30 patch 0  . meloxicam (MOBIC) 7.5 MG tablet Take 7.5 mg by mouth daily.    Marland Kitchen. METFORMIN HCL PO Take by mouth.    . methocarbamol (ROBAXIN) 500 MG tablet Take 1 tablet (500 mg total) by mouth 2 (two) times daily. 20 tablet 0  . predniSONE (DELTASONE) 20 MG tablet Two daily with food 10 tablet 0  . rosuvastatin (CRESTOR) 10 MG tablet Take 10 mg by mouth daily.    . sertraline (ZOLOFT) 50 MG tablet Take 50 mg by mouth daily.    . traZODone (DESYREL) 50 MG tablet Take 50 mg by mouth at bedtime.     No current facility-administered medications for this visit.   allergic to Hep b shot Poor sleep 20+ years      Current meds include: Metformin, meloxicam, Viagra, atorvastatin, HCTZ, nasal spray, Zoloft 50 mg a day, trazodone 50 mg a day, Soma, prednisone. Medication Side Effects: none  Orders placed this visit:  Started on doxazosin 1mg   1/2 a day for a week, then 1 whole tab/day Continue Zoloft 50 mg a day, continue trazodone 100 mg a day. Decrease caffiene  Psychiatric Specialty Exam:  ROS  There were no vitals taken for this visit.There is no height or weight on file to calculate BMI.  General Appearance: Casual  Eye Contact:  Good  Speech:  Normal Rate  Volume:  Normal  Mood:  Anxious  Affect:  Appropriate  Thought Process:  Linear  Orientation:  Full (Time, Place, and Person)  Thought Content: Logical   Suicidal Thoughts:  No  Homicidal Thoughts:  No  Memory:  WNL  Judgement:  Good  Insight:  Good  Psychomotor Activity:  Normal  Concentration:  Concentration: Good  Recall:  Good  Fund of  Knowledge: Good  Language: Good  Assets:  Desire for Improvement  ADL's:  Intact  Cognition: WNL  Prognosis:  Good   Screenings: mdq 6 yeses causing minor problems.   Receiving Psychotherapy: Ulice BoldCarson Sarvis  Treatment Plan/Recommendations: Patient is to start doxazosin 1 mg 1/2 tablet a day for a week and then 1 whole tablet.  Patient is to continue Zoloft 50 and trazodone 100.  Patient given a mood diary to keep.  Return in 1 month.    Anne Fulay Chas Axel, PA-C

## 2018-02-26 ENCOUNTER — Encounter: Payer: Self-pay | Admitting: Mental Health

## 2018-02-26 ENCOUNTER — Ambulatory Visit (INDEPENDENT_AMBULATORY_CARE_PROVIDER_SITE_OTHER): Admitting: Mental Health

## 2018-02-26 DIAGNOSIS — F411 Generalized anxiety disorder: Secondary | ICD-10-CM | POA: Diagnosis not present

## 2018-02-26 DIAGNOSIS — F4310 Post-traumatic stress disorder, unspecified: Secondary | ICD-10-CM | POA: Diagnosis not present

## 2018-02-26 NOTE — Progress Notes (Signed)
      [facility] Counselor/Therapist Progress Note  Patient ID: Alex Gamble., MRN: [id number],    Date: 02/26/2018  Time Spent: 45 minutes  Treatment Type: Individual Therapy  Reported Symptoms: Stressed about life change from [employer] retirement  Mental Status Exam:  Appearance:   Casual     Behavior:  Appropriate  Motor:  Normal  Speech/Language:   Clear and Coherent  Affect:  Appropriate  Mood:  euthymic  Thought process:  normal  Thought content:    WNL  Sensory/Perceptual disturbances:    WNL and Flashback  Orientation:  oriented to person, place and time/date  Attention:  Good  Concentration:  Good  Memory:  WNL  Fund of knowledge:   Good  Insight:    Good  Judgment:   Good  Impulse Control:  Good   Risk Assessment: Danger to Self:  No Self-injurious Behavior: No Danger to Others: No Duty to Warn:no Physical Aggression / Violence:No  Access to Firearms a concern: No  Gang Involvement:No   Subjective: 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 Job Financial controller. Some anxiety. Involved in church. Goal-setting and house hunting. On-line school,   Interventions: Cognitive Behavioral Therapy, Solution-Oriented/Positive Psychology, Insight-Oriented and Interpersonal  Diagnosis:   ICD-10-CM   1. Generalized anxiety disorder F41.1   2. PTSD (post-traumatic stress disorder) F43.10     Plan: Self care program           Parenting issues and long distance relationship management           Decrease anxiety, Increase sleep           Validation and support  Ulice Bold, [facility]

## 2018-03-20 ENCOUNTER — Ambulatory Visit: Admitting: Mental Health

## 2018-04-01 ENCOUNTER — Ambulatory Visit (INDEPENDENT_AMBULATORY_CARE_PROVIDER_SITE_OTHER): Admitting: Mental Health

## 2018-04-01 ENCOUNTER — Ambulatory Visit (INDEPENDENT_AMBULATORY_CARE_PROVIDER_SITE_OTHER): Admitting: Psychiatry

## 2018-04-01 DIAGNOSIS — F4310 Post-traumatic stress disorder, unspecified: Secondary | ICD-10-CM

## 2018-04-01 DIAGNOSIS — F411 Generalized anxiety disorder: Secondary | ICD-10-CM | POA: Diagnosis not present

## 2018-04-01 DIAGNOSIS — F39 Unspecified mood [affective] disorder: Secondary | ICD-10-CM | POA: Diagnosis not present

## 2018-04-01 MED ORDER — SERTRALINE HCL 50 MG PO TABS: 100.0000 mg | ORAL_TABLET | Freq: Every day | ORAL | 1 refills | Status: DC

## 2018-04-01 MED ORDER — DOXAZOSIN MESYLATE 1 MG PO TABS: ORAL_TABLET | ORAL | 1 refills | Status: DC

## 2018-04-01 NOTE — Progress Notes (Signed)
[facility] Med Check  Patient ID: Labryant, Chalifoux  MRN: [id number]  PCP: Gaspar Garbe, MD  Date of Evaluation: 04/01/2018 Time spent:20 minutes  Chief Complaint:   HISTORY/CURRENT STATUS: HPI patient seen initially 02/25/2018 diagnoses of posttraumatic stress disorder and mood disorder.  At his first visit we put him on Zoloft 50 mg trazodone 100 mg and doxazosin 1 mg.  The patient's trazodone apparently did not go through so he stayed at 50 mg a day. Currently he is continued to have nightmares once or twice a week but denies flashbacks.  Denies she is avoids confrontations and arguments.  Does not startle easily.  Heightened sense of awareness. Some depression he sleeps 4 hours a night.  Had sleep study and no apnea. PCP ordered study. Anxiety is high he has had one panic attack  Individual Medical History/ Review of Systems: Changes? :No   Allergies: Hepatitis b virus vaccines  Current Medications:  Current Outpatient Medications:  .  sertraline (ZOLOFT) 50 MG tablet, Take 2 tablets (100 mg total) by mouth daily. Take 2 tablets/day, Disp: 60 tablet, Rfl: 1 .  azithromycin (ZITHROMAX Z-PAK) 250 MG tablet, Per z pac, Disp: 1 each, Rfl: 0 .  cetirizine (ZYRTEC) 5 MG tablet, Take 1 tablet (5 mg total) by mouth daily., Disp: 30 tablet, Rfl: 1 .  doxazosin (CARDURA) 1 MG tablet, 1 and 1/2 half tab /day for a week, then 2 tabs/day, Disp: 60 tablet, Rfl: 1 .  fluticasone (FLONASE) 50 MCG/ACT nasal spray, Place 1 spray into both nostrils daily., Disp: 16 g, Rfl: 2 .  guaiFENesin (ROBITUSSIN) 100 MG/5ML liquid, Take 5-10 mLs (100-200 mg total) by mouth every 4 (four) hours as needed for cough., Disp: 60 mL, Rfl: 0 .  HYDROCHLOROTHIAZIDE PO, Take 25 mg by mouth daily. , Disp: , Rfl:  .  lidocaine (LIDODERM) 5 %, Place 1 patch onto the skin daily. Remove & Discard patch within 12 hours or as directed by MD, Disp: 30 patch, Rfl: 0 .  meloxicam (MOBIC) 7.5 MG tablet, Take 7.5 mg by  mouth daily., Disp: , Rfl:  .  METFORMIN HCL PO, Take by mouth., Disp: , Rfl:  .  methocarbamol (ROBAXIN) 500 MG tablet, Take 1 tablet (500 mg total) by mouth 2 (two) times daily., Disp: 20 tablet, Rfl: 0 .  predniSONE (DELTASONE) 20 MG tablet, Two daily with food, Disp: 10 tablet, Rfl: 0 .  rosuvastatin (CRESTOR) 10 MG tablet, Take 10 mg by mouth daily., Disp: , Rfl:  .  traZODone (DESYREL) 50 MG tablet, Take 50 mg by mouth at bedtime., Disp: , Rfl:  Medication Side Effects: none  Family Medical/ Social History: Changes? no  MENTAL HEALTH EXAM:  There were no vitals taken for this visit.There is no height or weight on file to calculate BMI.  General Appearance: Casual  Eye Contact:  Good  Speech:  Normal Rate  Volume:  Normal  Mood:  Depressed  Affect:  Appropriate  Thought Process:  Linear  Orientation:  Full (Time, Place, and Person)  Thought Content: Logical   Suicidal Thoughts:  No  Homicidal Thoughts:  No  Memory:  WNL  Judgement:  Good  Insight:  Good  Psychomotor Activity:  Normal  Concentration:  Concentration: Good  Recall:  Good  Fund of Knowledge: Good  Language: Good  Assets:  Desire for Improvement  ADL's:  Intact  Cognition: WNL  Prognosis:  Good    DIAGNOSES:    ICD-10-CM   1.  Post traumatic stress disorder F43.10   2. Episodic mood disorder (HCC) F39     Receiving Psychotherapy: No    RECOMMENDATIONS: Can increase the doxazosin to help more with PTSD.  Currently he is on 1 mg a day he is to go up to 1-1/2 tablets a day for a week and then 2 tablets.  Also increase his Zoloft for anxiety and depression to 100 mg a day.  He is to continue the trazodone at 50 mg a day, we can go up in the future if needed. Patient is return in 5month.  He was given a mood diary to keep.   Anne Fu, PA-C

## 2018-04-01 NOTE — Progress Notes (Signed)
      [facility] Counselor/Therapist Progress Note  Patient ID: Geonni Stimpert., MRN: [id number],    Date: 04/01/2018  Time Spent: 45 minutes  Treatment Type: Individual Therapy  Reported Symptoms: Bored, edgy, anxious. Too much empty time on hand.   Mental Status Exam:  Appearance:   Casual     Behavior:  Agitated  Motor:  Restlestness  Speech/Language:   Normal Rate  Affect:  moody, irritable, isolated  Mood:  anxious and irritable  Thought process:  goal directed  Thought content:    WNL  Sensory/Perceptual disturbances:    WNL  Orientation:  oriented to person, place and time/date  Attention:  Good  Concentration:  Good  Memory:  WNL  Fund of knowledge:   Good  Insight:    Good  Judgment:   Good  Impulse Control:  Good   Risk Assessment: Danger to Self:  No Self-injurious Behavior: No Danger to Others: No Duty to Warn:no Physical Aggression / Violence:No  Access to Firearms a concern: No  Gang Involvement:No   Subjective:  Waiting around for retirement date. Taking on line classes. Bored and lonely. Feels isolated. Anxious to get on with the next chapter of his life. Will be flying to [facility] every month to see daughter and plan to move there. Had sleep study and dx insomnia. Had elbow surgery four weeks ago and has some limitations in use of arm.  Restless with sitting around with nothing to do. Nightmares and flashbacks/  Interventions: Cognitive Behavioral Therapy, Solution-Oriented/Positive Psychology, Insight-Oriented and Interpersonal  Diagnosis:   ICD-10-CM   1. PTSD (post-traumatic stress disorder) F43.10   2. Generalized anxiety disorder F41.1     Plan:  Self care program            Rehabilitation of arm            Job search            Decrease symptoms of anxiety            Validation and support     Ulice Bold, [facility]

## 2018-04-28 ENCOUNTER — Ambulatory Visit: Admitting: Psychiatry

## 2018-04-28 ENCOUNTER — Ambulatory Visit: Admitting: Mental Health

## 2018-04-30 ENCOUNTER — Other Ambulatory Visit: Payer: Self-pay | Admitting: Psychiatry

## 2018-05-06 ENCOUNTER — Ambulatory Visit (INDEPENDENT_AMBULATORY_CARE_PROVIDER_SITE_OTHER): Admitting: Mental Health

## 2018-05-06 ENCOUNTER — Other Ambulatory Visit: Payer: Self-pay

## 2018-05-06 ENCOUNTER — Encounter: Payer: Self-pay | Admitting: Mental Health

## 2018-05-06 DIAGNOSIS — F411 Generalized anxiety disorder: Secondary | ICD-10-CM

## 2018-05-06 DIAGNOSIS — F4310 Post-traumatic stress disorder, unspecified: Secondary | ICD-10-CM

## 2018-05-06 NOTE — Progress Notes (Signed)
[facility] Counselor/Therapist Progress Note  Patient ID: Alex Pedreira., MRN: [id number],    Date: 05/06/2018   I connected with patient by a video enabled telemedicine application or telephone, with their informed consent, and verified patient privacy and that I am speaking with the correct person using two identifiers.  I was located at home and patient at home.   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 Sleep is better but still waking up.  Mental Status Exam:  Appearance:   unseen - phone session     Behavior:  Appropriate, Sharing and reported to be restless  Motor:  Normal  Speech/Language:   Normal Rate  Affect:  Full Range  Mood:  normal  Thought process:  normal  Thought content:    WNL  Sensory/Perceptual disturbances:    WNL  Orientation:  oriented to person, place and time/date  Attention:  Good  Concentration:  Good  Memory:  WNL  Fund of knowledge:   Good  Insight:    Good  Judgment:   Good  Impulse Control:  Good   Risk Assessment: Danger to Self:  No Self-injurious Behavior: No Danger to Others: No Duty to Warn:no Physical Aggression / Violence:No  Access to Firearms a concern: No  Gang Involvement:No   Subjective: Isolated due to Pandemic. Trying to stay busy. Able to go for runs and watch TV. Involved in Facebook to keep track of his guys in the service and his daughter in [place]. Coping with uncertainty of job market after the flu passes.   Interventions: Cognitive Behavioral Therapy, Solution-Oriented/Positive Psychology, Insight-Oriented and Family Systems  Diagnosis:   ICD-10-CM   1. Generalized anxiety disorder F41.1   2. PTSD (post-traumatic stress disorder) F43.10       Treatment Plan   Patient Name:  Alex Gamble   Date:  May 06, 2018   Didactic topic to be discussed:           Anxiety:                   Locus of control                              Work/Life balance           Depression                             Problem-solving                              Relationships                                   Boundaries                                     Coping srategies                             Communication  Recovery from trauma                    Self-care                                     Validation  Other     Goals:  Patient  1. Maintains mood stabiity:  decreased symptoms of     depression     anxiety  2.   Practices pro-active self-care:   restful sleep, nutrition, exercise, socialization  3.   Effective utilizes boundaries and sets limits  4.   Utliizes coping strategies and problem solving techniques for stress management  5.   Feels accurately heard, understood and validated  Other: Develops patience for the unknown      Tenny Craw [facility]     Ulice Bold, [facility]

## 2018-05-20 ENCOUNTER — Ambulatory Visit: Admitting: Psychiatry

## 2018-06-16 ENCOUNTER — Ambulatory Visit: Admitting: Psychiatry

## 2018-06-17 ENCOUNTER — Other Ambulatory Visit: Payer: Self-pay

## 2018-06-17 ENCOUNTER — Ambulatory Visit (INDEPENDENT_AMBULATORY_CARE_PROVIDER_SITE_OTHER): Admitting: Mental Health

## 2018-06-17 ENCOUNTER — Encounter: Payer: Self-pay | Admitting: Mental Health

## 2018-06-17 DIAGNOSIS — F4310 Post-traumatic stress disorder, unspecified: Secondary | ICD-10-CM | POA: Diagnosis not present

## 2018-06-17 DIAGNOSIS — F411 Generalized anxiety disorder: Secondary | ICD-10-CM

## 2018-06-17 NOTE — Progress Notes (Signed)
[facility] Counselor/Therapist Progress Note  Patient ID: Alex ChalkSemar B Lank Jr., MRN: [id number],    Date: 06/17/2018   Telehealth visit I connected with patient by a video enabled telemedicine/telehealth application or telephone, with his informed consent, and verified patient privacy and that I am speaking with the correct person using two identifiers.  I was located at my home and patient at his home.  We discussed the limitations, risks, and security and privacy concerns associated with telehealth services and the availability of in-person appointments, including awareness that he may be responsible for charges related to the service, and he expressed understanding and agreed to proceed. Corona Virus Pandemic  1:00 PM  I discussed treatment planning with him, with opportunity to ask and answer all questions. Agreed with the plan, demonstrated an understanding of the instructions, and made him aware to call our office if symptoms worsen or he feels he is in a crisis state and needs immediate contact.  Time Spent:  40 minutes  Treatment Type: Individual Therapy  Reported Symptoms: Mood stable, has energy and motivation. Able to enjoy. Has positive anticipations. Eager to get this pandemic over and move to [place] and look for job.  Mental Status Exam:  Appearance:   unseen - telephone     Behavior:  Appropriate  Motor:  Normal  Speech/Language:   Clear and Coherent  Affect:  Appropriate  Mood:  normal  Thought process:  normal  Thought content:    WNL  Sensory/Perceptual disturbances:    WNL  Orientation:  oriented to person, place and time/date  Attention:  Good  Concentration:  Good  Memory:  WNL  Fund of knowledge:   Good  Insight:    Good  Judgment:   Good  Impulse Control:  Good   Risk Assessment: Danger to Self:  No Self-injurious Behavior: No Danger to Others: No Duty to Warn:no Physical Aggression / Violence:No  Access to Firearms a concern: No  Gang  Involvement:No   Subjective: Eager to have a decision about whether he can extend his time to retirement to get out of the [employer] Virus quarantine before moving to [place] due to unavailability to jobs right now. Running every morning and purchased equipment to set up gym in his apartment. Finishing up school. Sister who has completed PA school has been [facility]. Sherron MondayHillary has been here since she is not currently flying.watches Netflix every afternoon. Watching cooking shows. Able to get what provisions he needs. Mood stable. Antsy about being in the unknown but using healthy tools to manage anxiety. Eager to get back to [place] where his daughter lives.    Interventions: Solution-Oriented/Positive Psychology, Insight-Oriented and Interpersonal  Diagnosis:   ICD-10-CM   1. Generalized anxiety disorder F41.1   2. PTSD (post-traumatic stress disorder) F43.10      Treatment Plan   Patient Name:  Alex Gamble   Date: Jun 17, 2018   Didactic topic to be discussed:           Anxiety:                   Locus of control                              Work/Life balance           Depression  Problem-solving                              Relationships                                   Boundaries                                     Coping srategies                             Communication                    Recovery from trauma                    Self-care                                     Validation  Other     Goals:  Patient  1. Maintains mood stabiity:  decreased symptoms of     depression     anxiety  2.   Practices pro-active self-care:   restful sleep, nutrition, exercise, socialization  3.   Effective utilizes boundaries and sets limits  4.   Utliizes coping strategies and problem solving techniques for stress management  5.   Feels accurately heard, understood and validated  Other      Tenny Craw  [facility]     Ulice Bold, [facility]

## 2018-06-27 ENCOUNTER — Other Ambulatory Visit: Payer: Self-pay

## 2018-06-27 ENCOUNTER — Ambulatory Visit (INDEPENDENT_AMBULATORY_CARE_PROVIDER_SITE_OTHER): Admitting: Physician Assistant

## 2018-06-27 ENCOUNTER — Encounter: Payer: Self-pay | Admitting: Physician Assistant

## 2018-06-27 DIAGNOSIS — F411 Generalized anxiety disorder: Secondary | ICD-10-CM | POA: Diagnosis not present

## 2018-06-27 DIAGNOSIS — F4310 Post-traumatic stress disorder, unspecified: Secondary | ICD-10-CM | POA: Diagnosis not present

## 2018-06-27 DIAGNOSIS — G4700 Insomnia, unspecified: Secondary | ICD-10-CM

## 2018-06-27 MED ORDER — TRAZODONE HCL 100 MG PO TABS: 100.0000 mg | ORAL_TABLET | Freq: Every evening | ORAL | 0 refills | Status: DC | PRN

## 2018-06-27 NOTE — Progress Notes (Signed)
[facility] Med Check  Patient ID: Noemi, Brissey  MRN: [id number]  PCP: Gaspar Garbe, MD  Date of Evaluation: 06/27/2018 Time spent:15 minutes  Chief Complaint:  Chief Complaint    Follow-up     Virtual Visit via Telephone Note  I connected with patient by a video enabled telemedicine application or telephone, with their informed consent, and verified patient privacy and that I am speaking with the correct person using two identifiers.  I am private, in my home and the patient is home.  I discussed the limitations, risks, security and privacy concerns of performing an evaluation and management service by telephone and the availability of in person appointments. I also discussed with the patient that there may be a patient responsible charge related to this service. The patient expressed understanding and agreed to proceed.   I discussed the assessment and treatment plan with the patient. The patient was provided an opportunity to ask questions and all were answered. The patient agreed with the plan and demonstrated an understanding of the instructions.   The patient was advised to call back or seek an in-person evaluation if the symptoms worsen or if the condition fails to improve as anticipated.  I provided 15 minutes of non-face-to-face time during this encounter.  HISTORY/CURRENT STATUS: HPI for routine med check.  Reports not sleeping well.  Trazodone 50 mg is not working.  He was increased to 100 mg but told he was not will stay on that.  Patient is not sure why but it was effective.  He has trouble falling asleep and only gets 4 hours of sleep at times.  States nightmares are much better, mostly nonexistent.  He feels that the medication is helping.  No flashbacks.  States mood is good.  He is able to enjoy things.  Energy and motivation are good.  No isolating other than what he has to do because of the coronavirus pandemic shelter in place orders.  He does not  cry easily.  He has no suicidal or homicidal thoughts.  Denies dizziness, syncope, seizures, numbness, tingling, tremor, tics, unsteady gait, slurred speech, confusion. Denies muscle or joint pain, stiffness, or dystonia.  Individual Medical History/ Review of Systems: Changes? :Yes  elbow surgery.    Past medications for mental health diagnoses include: Unknown  Allergies: Hepatitis b virus vaccines  Current Medications:  Current Outpatient Medications:  .  cetirizine (ZYRTEC) 5 MG tablet, Take 1 tablet (5 mg total) by mouth daily., Disp: 30 tablet, Rfl: 1 .  doxazosin (CARDURA) 1 MG tablet, TAKE 1 AND 1/2 TABLETS AT BEDTIME FOR A WEEK, THEN INCREASE TO 2 AT BEDTIME, Disp: 60 tablet, Rfl: 1 .  fluticasone (FLONASE) 50 MCG/ACT nasal spray, Place 1 spray into both nostrils daily., Disp: 16 g, Rfl: 2 .  HYDROCHLOROTHIAZIDE PO, Take 25 mg by mouth daily. , Disp: , Rfl:  .  meloxicam (MOBIC) 7.5 MG tablet, Take 7.5 mg by mouth daily., Disp: , Rfl:  .  METFORMIN HCL PO, Take by mouth., Disp: , Rfl:  .  methocarbamol (ROBAXIN) 500 MG tablet, Take 1 tablet (500 mg total) by mouth 2 (two) times daily., Disp: 20 tablet, Rfl: 0 .  rosuvastatin (CRESTOR) 10 MG tablet, Take 10 mg by mouth daily., Disp: , Rfl:  .  sertraline (ZOLOFT) 50 MG tablet, Take 2 tablets (100 mg total) by mouth daily. Take 2 tablets/day, Disp: 60 tablet, Rfl: 1 .  azithromycin (ZITHROMAX Z-PAK) 250 MG tablet, Per z pac (Patient  not taking: Reported on 06/27/2018), Disp: 1 each, Rfl: 0 .  guaiFENesin (ROBITUSSIN) 100 MG/5ML liquid, Take 5-10 mLs (100-200 mg total) by mouth every 4 (four) hours as needed for cough. (Patient not taking: Reported on 06/27/2018), Disp: 60 mL, Rfl: 0 .  lidocaine (LIDODERM) 5 %, Place 1 patch onto the skin daily. Remove & Discard patch within 12 hours or as directed by MD (Patient not taking: Reported on 06/27/2018), Disp: 30 patch, Rfl: 0 .  predniSONE (DELTASONE) 20 MG tablet, Two daily with food  (Patient not taking: Reported on 06/27/2018), Disp: 10 tablet, Rfl: 0 .  traZODone (DESYREL) 100 MG tablet, Take 1 tablet (100 mg total) by mouth at bedtime as needed for sleep., Disp: 90 tablet, Rfl: 0 Medication Side Effects: none  Family Medical/ Social History: Changes? Yes is trying to retire from the [employer].  Because of the coronavirus pandemic, his retirement is on hold.  They reassess every 2 weeks.  "Right now I am in limbo.  That is causing a lot of anxiety.  I may be retiring next month and moving but I am not sure at this point."  MENTAL HEALTH EXAM:  There were no vitals taken for this visit.There is no height or weight on file to calculate BMI.  General Appearance: unable to assess  Eye Contact:  unable to assess  Speech:  Clear and Coherent  Volume:  Normal  Mood:  Euthymic  Affect:  Unable to assess  Thought Process:  Goal Directed  Orientation:  Full (Time, Place, and Person)  Thought Content: Logical   Suicidal Thoughts:  No  Homicidal Thoughts:  No  Memory:  WNL  Judgement:  Good  Insight:  Good  Psychomotor Activity:  Able to assess  Concentration:  Concentration: Good  Recall:  Good  Fund of Knowledge: Good  Language: Good  Assets:  Desire for Improvement  ADL's:  Intact  Cognition: WNL  Prognosis:  Good    DIAGNOSES:    ICD-10-CM   1. Insomnia, unspecified type G47.00   2. Post traumatic stress disorder F43.10   3. Generalized anxiety disorder F41.1     Receiving Psychotherapy: Yes With Ulice Boldarson Sarvis, LPC.   RECOMMENDATIONS:  Increase trazodone to 100 mg nightly as needed.  It is okay to take the higher dose as long as blood pressure staying okay.  He understands and will watch. Continue Zoloft 100 mg daily. Continue doxazosin 1 mg, 2 p.o. nightly. Sleep hygiene discussed. Continue psychotherapy with Ulice Boldarson Sarvis, LPC. Return in 6 weeks.   Melony Overlyeresa Darcelle Herrada, PA-C   This record has been created using [employer] software.  Chart creation errors have  been sought, but may not always have been located and corrected. Such creation errors do not reflect on the standard of medical care.

## 2018-07-07 ENCOUNTER — Ambulatory Visit: Admitting: Mental Health

## 2018-07-11 IMAGING — CR DG CHEST 2V
2 series · 2 of 2 positions shown · non-contrast
Comparison: 01/04/2017

CLINICAL DATA: Pt c/o CP middle of his chest down to the right,
pain is reproducible with movement. Tender to palpation. Denies
injury. Pt states pain started this AM. Pt has hx of high bp and
diabetes but does not take meds for either.

EXAM:
CHEST - 2 VIEW

[chest pa]
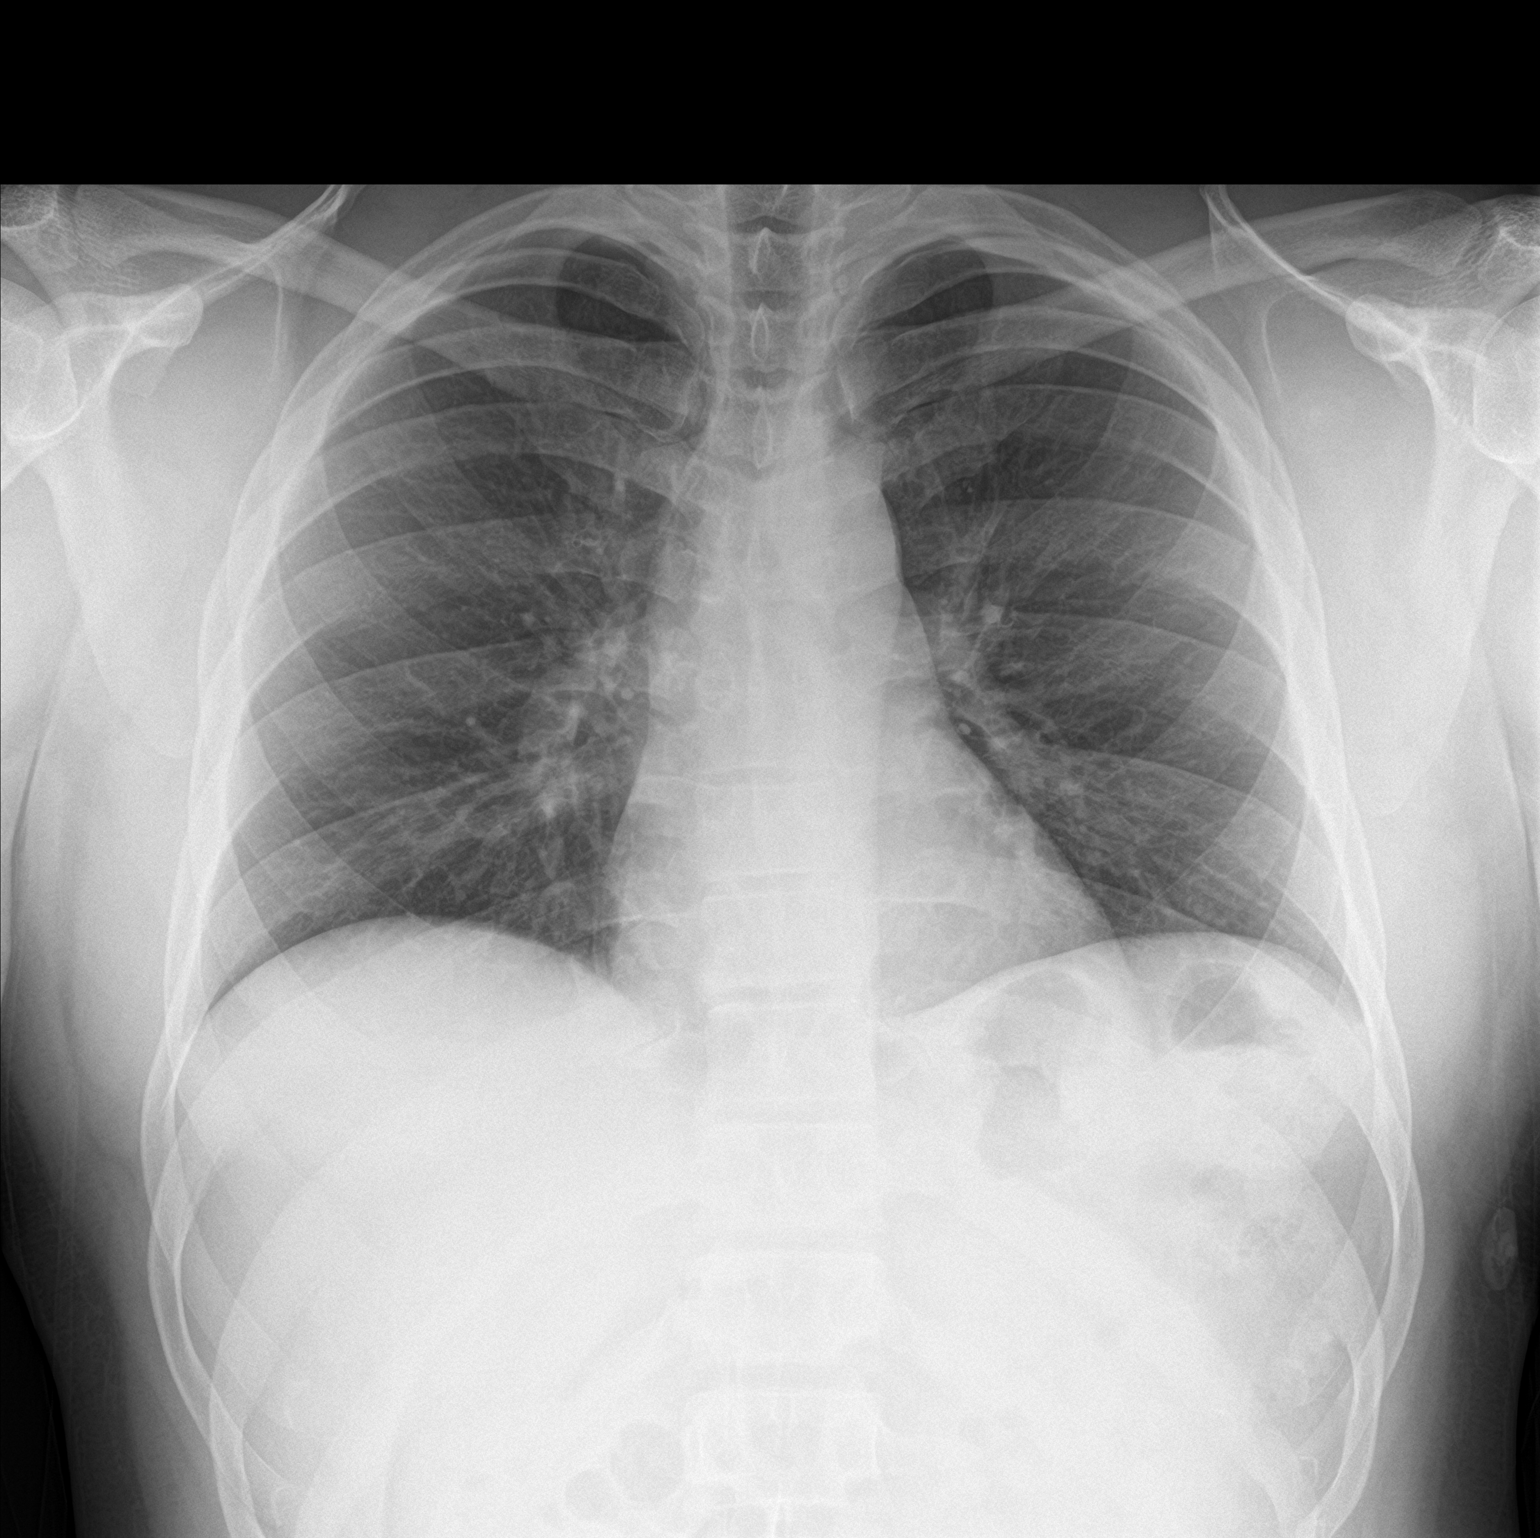

[chest lat]
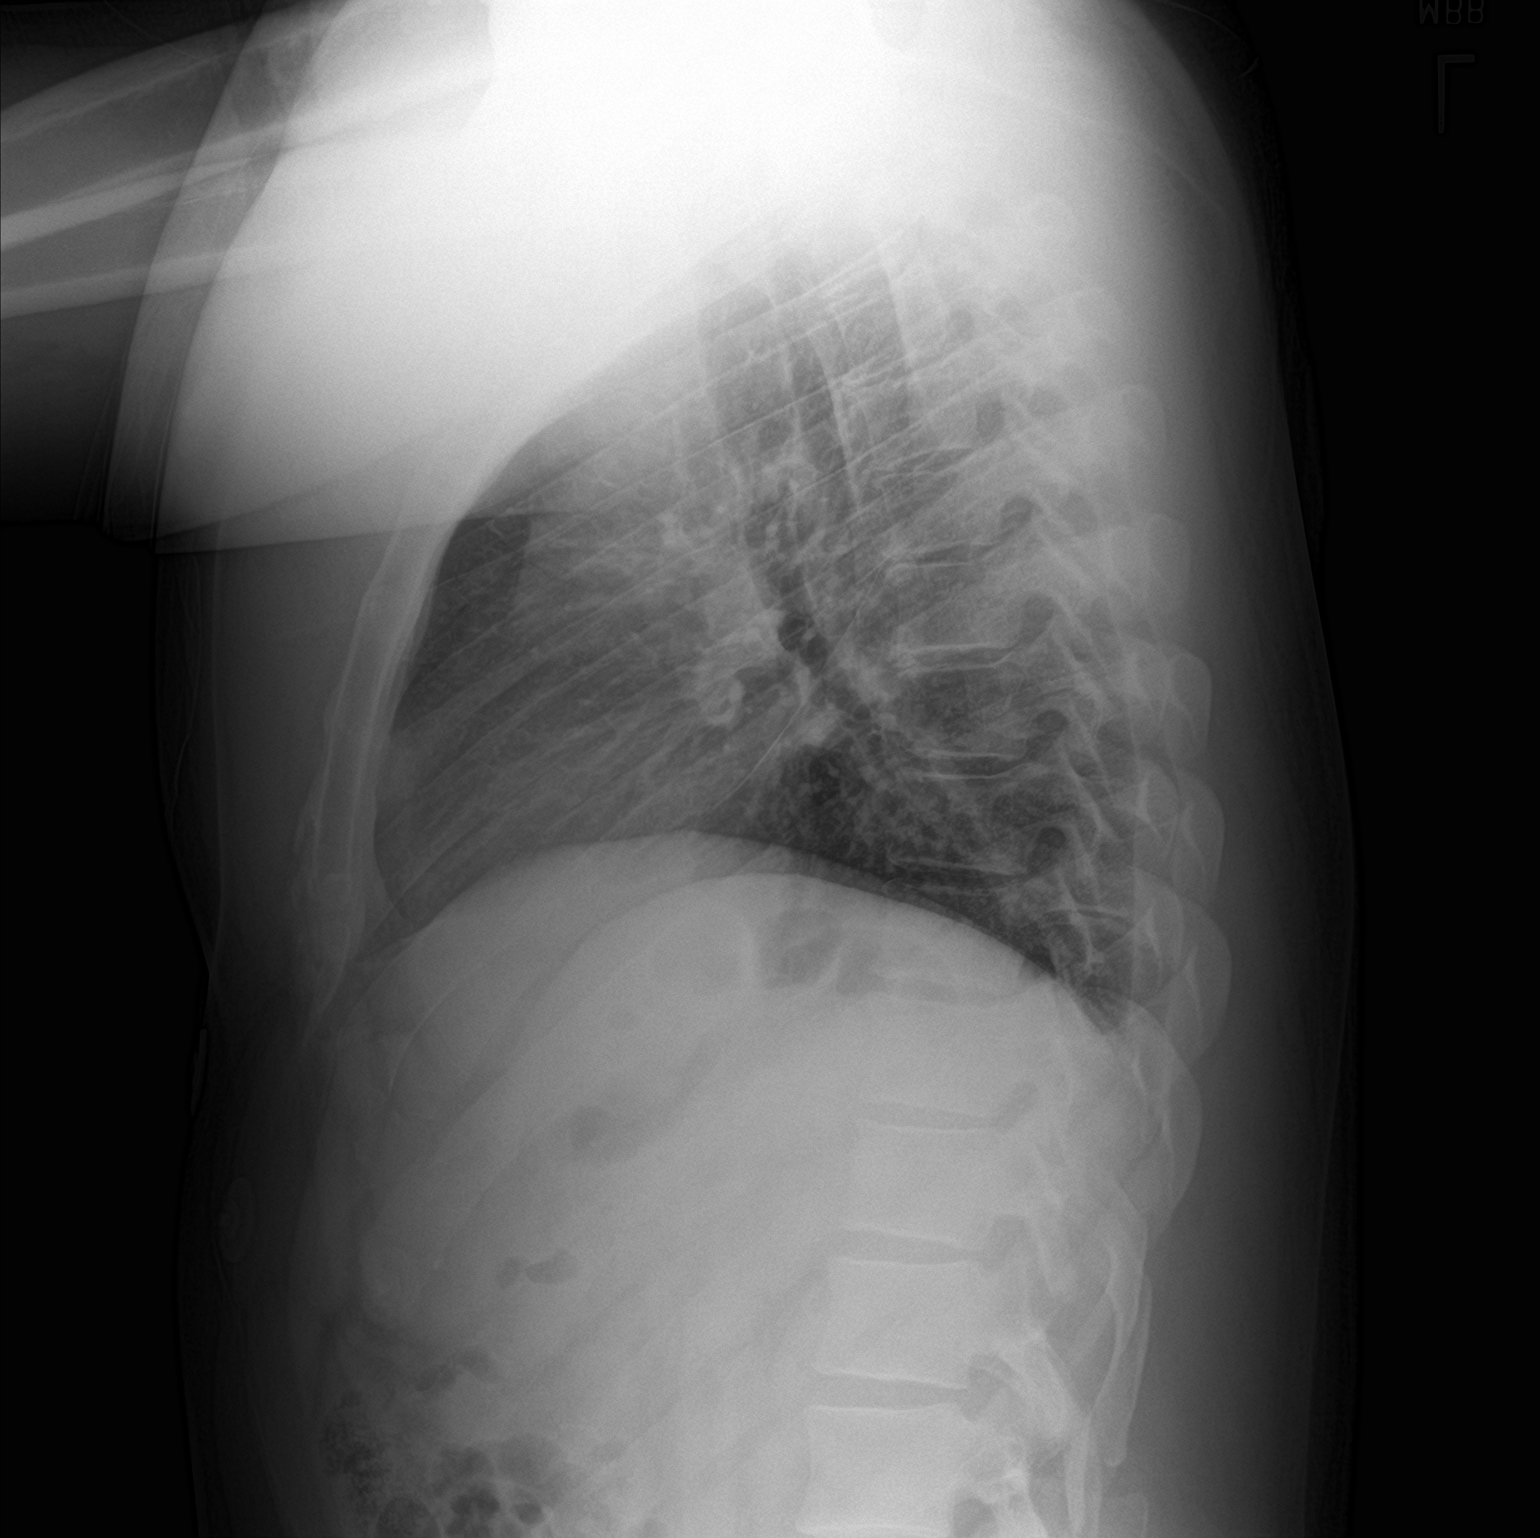

[2 of 2 positions shown; findings below may reference images not displayed]

FINDINGS: The heart size and mediastinal contours are within normal limits.
Both lungs are clear. No pleural effusion or pneumothorax. The
visualized skeletal structures are unremarkable.
IMPRESSION: Normal chest radiographs.

## 2018-07-14 ENCOUNTER — Ambulatory Visit (INDEPENDENT_AMBULATORY_CARE_PROVIDER_SITE_OTHER): Admitting: Mental Health

## 2018-07-14 ENCOUNTER — Encounter: Payer: Self-pay | Admitting: Mental Health

## 2018-07-14 ENCOUNTER — Other Ambulatory Visit: Payer: Self-pay

## 2018-07-14 DIAGNOSIS — F4310 Post-traumatic stress disorder, unspecified: Secondary | ICD-10-CM

## 2018-07-14 NOTE — Progress Notes (Signed)
[facility] Counselor/Therapist Progress Note  Patient ID: Alex Finnigan., MRN: [id number],    Date: 07/14/2018  Time Spent: 55 minutes  Treatment Type: Individual Therapy  Reported Symptoms: frustrated with so many things pending. Sleeping well with Trazedone increase. Polite and cooperative. Talkative. Denies depression. Some anxiety. Highly structured and consistent.  Mental Status Exam:  Appearance:   Well Groomed     Behavior:  Appropriate and Sharing  Motor:  Normal  Speech/Language:   Normal  Affect:  Full Range  Mood:  anxious  Thought process:  normal  Thought content:    WNL  Sensory/Perceptual disturbances:    WNL  Orientation:  oriented to person, place and time/date  Attention:  Good  Concentration:  Good  Memory:  WNL  Fund of knowledge:   Good  Insight:    Good  Judgment:   Good  Impulse Control:  Good   Risk Assessment: Danger to Self:  No Self-injurious Behavior: No Danger to Others: No Duty to Warn:no Physical Aggression / Violence:No  Access to Firearms a concern: No  Gang Involvement:No   Subjective:  Has been able to extend an additional year until retirement from the [employer]. Waiting to be relocated either to [facility] or [place] in 6 weeks. Very structured during pandemic. Strong ESFJ personality and eager to come to closure on so many pending things in his life. Frustrated but denied depression. Sleeping wiell with increase in [place]. Mood stable generally.  Interventions: Solution-Oriented/Positive Psychology, Insight-Oriented and Interpersonal  Diagnosis:   ICD-10-CM   1. PTSD (post-traumatic stress disorder) F43.10     Plan:   Treatment Plan   Patient Name:Alex Gamble   Date: July 14, 2018   Didactic topic to be discussed:           Anxiety:                   Locus of control                              Work/Life balance           Depression                             Problem-solving                               Relationships                                   Boundaries                                     Coping srategies                             Communication                    Recovery from trauma                    Self-care  Validation  Other     Goals:  Patient  1. Maintains mood stabiity:  decreased symptoms of     depression     anxiety  2.   Practices pro-active self-care:   restful sleep, nutrition, exercise, socialization  3.   Effective utilizes boundaries and sets limits  4.   Utliizes coping strategies and problem solving techniques for stress management  5.   Feels accurately heard, understood and validated  Other     Ulice Boldarson Yul Diana, [facility]

## 2018-08-12 ENCOUNTER — Ambulatory Visit: Admitting: Physician Assistant

## 2018-09-17 ENCOUNTER — Encounter: Payer: Self-pay | Admitting: Physician Assistant

## 2018-09-17 ENCOUNTER — Other Ambulatory Visit: Payer: Self-pay

## 2018-09-17 ENCOUNTER — Ambulatory Visit (INDEPENDENT_AMBULATORY_CARE_PROVIDER_SITE_OTHER): Admitting: Physician Assistant

## 2018-09-17 DIAGNOSIS — F4310 Post-traumatic stress disorder, unspecified: Secondary | ICD-10-CM

## 2018-09-17 DIAGNOSIS — F411 Generalized anxiety disorder: Secondary | ICD-10-CM | POA: Diagnosis not present

## 2018-09-17 DIAGNOSIS — G4700 Insomnia, unspecified: Secondary | ICD-10-CM | POA: Diagnosis not present

## 2018-09-17 MED ORDER — TRAZODONE HCL 100 MG PO TABS: 100.0000 mg | ORAL_TABLET | Freq: Every evening | ORAL | 5 refills | Status: DC | PRN

## 2018-09-17 MED ORDER — DOXAZOSIN MESYLATE 1 MG PO TABS: 1.0000 mg | ORAL_TABLET | Freq: Every day | ORAL | 5 refills | Status: AC

## 2018-09-17 MED ORDER — SERTRALINE HCL 100 MG PO TABS: 100.0000 mg | ORAL_TABLET | Freq: Every day | ORAL | 5 refills | Status: AC

## 2018-09-17 NOTE — Progress Notes (Signed)
[facility]  Patient ID: Alex, Gamble  MRN: [id number]  PCP: Alex Pao, MD  Date of Evaluation: 09/17/2018 Time spent:15 minutes  Chief Complaint:  Chief Complaint    Insomnia; Follow-up     Virtual Visit via Telephone Note  I connected with patient by a video enabled telemedicine application or telephone, with their informed consent, and verified patient privacy and that I am speaking with the correct person using two identifiers.  I am private, in my home and the patient is home.   I discussed the limitations, risks, security and privacy concerns of performing an evaluation and management service by telephone and the availability of in person appointments. I also discussed with the patient that there Gamble be a patient responsible charge related to this service. The patient expressed understanding and agreed to proceed.   I discussed the assessment and treatment plan with the patient. The patient was provided an opportunity to ask questions and all were answered. The patient agreed with the plan and demonstrated an understanding of the instructions.   The patient was advised to call back or seek an in-person evaluation if the symptoms worsen or if the condition fails to improve as anticipated.  I provided 15 minutes of non-face-to-face time during this encounter.  HISTORY/CURRENT STATUS: HPI For routine med check.   Doing well for the most part.  He has his 51 year old daughter full-time now because her mother is a Pharmacist, hospital and her schedule is not as flexible as he is is right now.  Things are going well but it is just a change in his routine.  He is doing classes online for retirement purposes through [employer] and he can do those as needed and work around his daughter's schedule.  He is still hoping to transfer and move to [place], tentative date is in November now.  That is always subject to change.  Since we increased the trazodone he is sleeping much  better.  The nightmares and flashbacks are a lot less.  He is tolerating the doxazosin just fine.  No dizziness or weakness.  He was triggered last week when there was an explosion in [place], concerned that he would be going back to war but felt better when they found out that was not a bomb.  He is able to enjoy things.  He exercises routinely.  Energy and motivation are good.  He is not isolating any more than he has to due to the coronavirus pandemic.  Denies suicidal or homicidal thoughts.  Anxiety is controlled unless triggered as noted above.  Denies dizziness, syncope, seizures, numbness, tingling, tremor, tics, unsteady gait, slurred speech, confusion. Denies muscle or joint pain, stiffness, or dystonia.  Individual Medical History/ Review of Systems: Changes? :No    Past medications for mental health diagnoses include: unknown  Allergies: Hepatitis b virus vaccines  Current Medications:  Current Outpatient Medications:  .  cetirizine (ZYRTEC) 5 MG tablet, Take 1 tablet (5 mg total) by mouth daily., Disp: 30 tablet, Rfl: 1 .  doxazosin (CARDURA) 1 MG tablet, Take 1 tablet (1 mg total) by mouth daily., Disp: 30 tablet, Rfl: 5 .  fluticasone (FLONASE) 50 MCG/ACT nasal spray, Place 1 spray into both nostrils daily., Disp: 16 g, Rfl: 2 .  HYDROCHLOROTHIAZIDE PO, Take 25 mg by mouth daily. , Disp: , Rfl:  .  meloxicam (MOBIC) 7.5 MG tablet, Take 7.5 mg by mouth daily., Disp: , Rfl:  .  METFORMIN HCL PO, Take by  mouth., Disp: , Rfl:  .  methocarbamol (ROBAXIN) 500 MG tablet, Take 1 tablet (500 mg total) by mouth 2 (two) times daily., Disp: 20 tablet, Rfl: 0 .  rosuvastatin (CRESTOR) 10 MG tablet, Take 10 mg by mouth daily., Disp: , Rfl:  .  traZODone (DESYREL) 100 MG tablet, Take 1 tablet (100 mg total) by mouth at bedtime as needed for sleep., Disp: 30 tablet, Rfl: 5 .  azithromycin (ZITHROMAX Z-PAK) 250 MG tablet, Per z pac (Patient not taking: Reported on 06/27/2018), Disp: 1 each, Rfl:  0 .  guaiFENesin (ROBITUSSIN) 100 MG/5ML liquid, Take 5-10 mLs (100-200 mg total) by mouth every 4 (four) hours as needed for cough. (Patient not taking: Reported on 06/27/2018), Disp: 60 mL, Rfl: 0 .  lidocaine (LIDODERM) 5 %, Place 1 patch onto the skin daily. Remove & Discard patch within 12 hours or as directed by MD (Patient not taking: Reported on 06/27/2018), Disp: 30 patch, Rfl: 0 .  predniSONE (DELTASONE) 20 MG tablet, Two daily with food (Patient not taking: Reported on 06/27/2018), Disp: 10 tablet, Rfl: 0 .  sertraline (ZOLOFT) 100 MG tablet, Take 1 tablet (100 mg total) by mouth daily., Disp: 30 tablet, Rfl: 5 Medication Side Effects: none  Family Medical/ Social History: Changes? Yes see above  MENTAL HEALTH EXAM:  There were no vitals taken for this visit.There is no height or weight on file to calculate BMI.  General Appearance: Unable to assess  Eye Contact:  Unable to assess  Speech:  Clear and Coherent  Volume:  Normal  Mood:  Euthymic  Affect:  Unable to assess  Thought Process:  Goal Directed  Orientation:  Full (Time, Place, and Person)  Thought Content: Logical   Suicidal Thoughts:  No  Homicidal Thoughts:  No  Memory:  WNL  Judgement:  Good  Insight:  Good  Psychomotor Activity:  Unable to assess  Concentration:  Concentration: Good  Recall:  Good  Fund of Knowledge: Good  Language: Good  Assets:  Desire for Improvement  ADL's:  Intact  Cognition: WNL  Prognosis:  Good    DIAGNOSES:    ICD-10-CM   1. PTSD (post-traumatic stress disorder)  F43.10   2. Generalized anxiety disorder  F41.1   3. Insomnia, unspecified type  G47.00     Receiving Psychotherapy: Yes [facility] before she retired from our practice   RECOMMENDATIONS:  I am glad to see him doing so well! Continue doxazosin 1 mg daily. Continue Zoloft 100 mg p.o. daily. Continue trazodone 100 mg nightly as needed. He will check with Alex Gamble and see if he is able to  continue therapy with her.  If not, I have recommended he see [facility], [facility] in our office because he works with a lot of patients who have PTSD.  If it Gamble be a while for an appointment, he will see the first available therapist. Return in 6 months, if he is still in the area.  If not and he would like to do a tele-visit that is fine as long as his insurance pays.   Melony Overlyeresa Alexandra Lipps, PA-C   This record has been created using [employer] software.  Chart creation errors have been sought, but Gamble not always have been located and corrected. Such creation errors do not reflect on the standard of medical care.

## 2018-10-31 ENCOUNTER — Other Ambulatory Visit: Payer: Self-pay | Admitting: Physician Assistant

## 2018-12-10 ENCOUNTER — Other Ambulatory Visit: Payer: Self-pay

## 2018-12-10 ENCOUNTER — Encounter: Payer: Self-pay | Admitting: Psychiatry

## 2018-12-10 ENCOUNTER — Ambulatory Visit (INDEPENDENT_AMBULATORY_CARE_PROVIDER_SITE_OTHER): Admitting: Psychiatry

## 2018-12-10 DIAGNOSIS — F411 Generalized anxiety disorder: Secondary | ICD-10-CM

## 2018-12-10 NOTE — Progress Notes (Signed)
[facility] Counselor/Therapist Progress Note  Patient ID: Alex Gamble., MRN: [id number],    Date: 12/10/2018  Time Spent: 50 minutes   Treatment Type: Individual Therapy  Reported Symptoms: anxiety  Mental Status Exam:  Appearance:   Casual     Behavior:  Appropriate  Motor:  Normal  Speech/Language:   Clear and Coherent  Affect:  Appropriate  Mood:  anxious  Thought process:  normal  Thought content:    WNL  Sensory/Perceptual disturbances:    WNL  Orientation:  oriented to person, place, time/date and situation  Attention:  Good  Concentration:  Good  Memory:  WNL  Fund of knowledge:   Good  Insight:    Good  Judgment:   Good  Impulse Control:  Good   Risk Assessment: Danger to Self:  No Self-injurious Behavior: No Danger to Others: No Duty to Warn:no Physical Aggression / Violence:No  Access to Firearms a concern: No  Gang Involvement:No   Subjective: The client states that he needs some help with his anxiety.  He states that when he is in unknown situations his anxiety goes up and he begins to second-guess himself.  His main 2 issues that bother him are of the decision to quit the [employer] after 20 years and not being there for his 39 year old daughter. The client is married.  His wife and 2 year old daughter live in [place], [place].  They are technically married but lives separately and have so for years.  He recently bought a house in [place], [place] which he will be moving to in the next 2 months.  He is going to be looking for work since he is retiring from [employer]. I used eye-movement with the client around the issues of quitting the [employer] and not being there for his daughter.  His negative cognition is, "I cannot trust my judgment."  He feels anxiety in his chest.  His subjective units of distress is 10.  As the client processed he noticed that he "zoned out."  He also noted that his anxiety decreased.  As we refocused on him quitting the [employer] he  states he gets a lot of input from other people which causes him to second-guessed himself.  I asked the client who also makes his decisions for him?  He states no one does but him.  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  He agreed.  As he processed this is anxiety dropped to a subjective units of distress of 4.  We discussed his daughter and he indicates that she is well connected to him.  His wife refuses to move from [place], [place].  "For all intents and purposes we live as if we are divorced."  He will be much closer to his daughter in [place] and also have much better job opportunities than he would in [place].  As this sunken with the client's subjective units of distress was less than 2.  His positive cognition at the end of the session was, "I can trust my judgment."  I gave the client a decision making tool to use.  He will be able to wait the pros and cons of decisions he needs to make as a way to evaluate them.  The client found the EMDR very helpful and felt surprisingly calm leaving the session.  Interventions:  Assertiveness/Communication, Motivational Interviewing, Solution-Oriented/Positive Psychology, Psycho-education/Bibliotherapy, [facility] Desensitization and Reprocessing (EMDR) and Insight-Oriented  Diagnosis:   ICD-10-CM   1. Generalized anxiety disorder  F41.1     Plan: Decision making tool, positive self talk, assertiveness, boundaries, self-care.  Ezella Kell, [facility]

## 2019-01-05 ENCOUNTER — Other Ambulatory Visit: Payer: Self-pay

## 2019-01-05 ENCOUNTER — Ambulatory Visit (INDEPENDENT_AMBULATORY_CARE_PROVIDER_SITE_OTHER): Admitting: Psychiatry

## 2019-01-05 ENCOUNTER — Encounter: Payer: Self-pay | Admitting: Psychiatry

## 2019-01-05 DIAGNOSIS — F411 Generalized anxiety disorder: Secondary | ICD-10-CM

## 2019-01-05 NOTE — Progress Notes (Signed)
[facility] Counselor/Therapist Progress Note  Patient ID: Euclide Granito., MRN: [id number],    Date: 01/05/2019  Time Spent: 50 minutes   Treatment Type: Individual Therapy  Reported Symptoms: anxiety, decreased sleep, stress  Mental Status Exam:  Appearance:   Casual     Behavior:  Appropriate  Motor:  Normal  Speech/Language:   Clear and Coherent  Affect:  Appropriate  Mood:  anxious  Thought process:  normal  Thought content:    WNL  Sensory/Perceptual disturbances:    WNL  Orientation:  oriented to person, place, time/date and situation  Attention:  Good  Concentration:  Good  Memory:  WNL  Fund of knowledge:   Good  Insight:    Good  Judgment:   Good  Impulse Control:  Good   Risk Assessment: Danger to Self:  No Self-injurious Behavior: No Danger to Others: No Duty to Warn:no Physical Aggression / Violence:No  Access to Firearms a concern: No  Gang Involvement:No   Subjective: I connected with this patient by an approved telecommunication method (video), with his informed consent, and verifying identity and patient privacy.  I was located at my office and patient at his home.  As needed, we discussed the limitations, risks, and security and privacy concerns associated with telehealth service, including the availability and conditions which currently govern in-person appointments and the possibility that 3rd-party payment Tamar Miano not be fully guaranteed and he Eirik Schueler be responsible for charges.  After he indicated understanding, we proceeded with the session.  Also discussed treatment planning, as needed, including ongoing verbal agreement with the plan, the opportunity to ask and answer all questions, his demonstrated understanding of instructions, and his readiness to call the office should symptoms worsen or he feels he is in a crisis state and needs more immediate and tangible assistance. The client states that he is just relocated to [place], [place].  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  The client has recently retired from [employer] and has relocated to the [place] area to be closer to his 103 year old daughter in [place], [place].  He states the Thanksgiving holiday was stressful because his grandparents, his wife and his wife's brother all came down with COVID 19.  They have all successfully recovered.  The client has also driven across half the [place] with all his possessions.  When I spoke with him today he states he was very stressed out and anxious.  He wanted to have some coping mechanisms to deal with this. I discussed mindfulness with the client and will email him a handout with the practices listed.  We also discussed the need for regular exercise, engaged activities, regular exposure to sunlight, good sleep, good social network, and omega-3 fatty acids.  I also sent the client a handout with this information detail. Keeping his mind engaged will be important I explained to the client.  So the use of lists and schedules to accomplish his tasks will be helpful.  I also encouraged him to listen to a book, music or other activities that will engage his mind.  He agreed to do so. We discussed sleep hygiene.  The client has been having some difficulty with sleep.  We discussed supplements that I will send client a list of to help with sleep.  I advised him to always check with his pharmacist before he takes anything in case that is contraindicated with his current medications.    Interventions: Mindfulness Meditation, Motivational  Interviewing, Solution-Oriented/Positive Psychology, Psycho-education/Bibliotherapy and Insight-Oriented  Diagnosis:   ICD-10-CM   1. Generalized anxiety disorder  F41.1     Plan: Mindfulness, sleep hygiene, review supplements, exercise, self-care, schedules, lists, review handouts.  Haddy Mullinax, [facility]

## 2019-01-26 ENCOUNTER — Ambulatory Visit: Admitting: Psychiatry

## 2019-01-28 ENCOUNTER — Ambulatory Visit (INDEPENDENT_AMBULATORY_CARE_PROVIDER_SITE_OTHER): Admitting: Physician Assistant

## 2019-01-28 ENCOUNTER — Encounter: Payer: Self-pay | Admitting: Physician Assistant

## 2019-01-28 DIAGNOSIS — F411 Generalized anxiety disorder: Secondary | ICD-10-CM

## 2019-01-28 DIAGNOSIS — F39 Unspecified mood [affective] disorder: Secondary | ICD-10-CM

## 2019-01-28 DIAGNOSIS — F4310 Post-traumatic stress disorder, unspecified: Secondary | ICD-10-CM | POA: Diagnosis not present

## 2019-01-28 DIAGNOSIS — G4700 Insomnia, unspecified: Secondary | ICD-10-CM

## 2019-01-28 MED ORDER — TRAZODONE HCL 100 MG PO TABS: 100.0000 mg | ORAL_TABLET | Freq: Every evening | ORAL | 5 refills | Status: AC | PRN

## 2019-01-28 NOTE — Progress Notes (Signed)
[facility]  Patient ID: Marilyn, Wing  MRN: [id number]  PCP: Haywood Pao, MD  Date of Evaluation: 01/28/2019 Time spent:15 minutes  Chief Complaint:  Chief Complaint    Follow-up     Virtual Visit via Telephone Note  I connected with patient by a video enabled telemedicine application or telephone, with their informed consent, and verified patient privacy and that I am speaking with the correct person using two identifiers.  I am private, in my office and the patient is home.   I discussed the limitations, risks, security and privacy concerns of performing an evaluation and management service by telephone and the availability of in person appointments. I also discussed with the patient that there may be a patient responsible charge related to this service. The patient expressed understanding and agreed to proceed.   I discussed the assessment and treatment plan with the patient. The patient was provided an opportunity to ask questions and all were answered. The patient agreed with the plan and demonstrated an understanding of the instructions.   The patient was advised to call back or seek an in-person evaluation if the symptoms worsen or if the condition fails to improve as anticipated.  I provided 15 minutes of non-face-to-face time during this encounter.  HISTORY/CURRENT STATUS: HPI For routine med check.   He has had a lot of changes since the last visit.  He has moved to [place] to be closer to his 39 year old daughter.  Everything happened quickly once he was able to move there.  Things have calmed down though now he is there and getting settled.  Several of the extended family members had COVID but they all recovered well.  He also retired from [employer].  He is giving himself a month or so to recover from the move and all the stress, even though it is good stress, and then will get back to normal life toward the end of January or so.  Patient is not  sleeping as well as he would like.  He has trouble going to sleep and staying asleep at times.  He is using the trazodone which helped initially but is not helping as well as it did. Reports no worsening of dreams.  Sometimes he has vivid dreams but not like he used to.  Patient denies loss of interest in usual activities and is able to enjoy things.  Denies decreased energy or motivation.  Appetite has not changed.  No extreme sadness, tearfulness, or feelings of hopelessness.  Denies any changes in concentration, making decisions or remembering things.  Denies suicidal or homicidal thoughts.    States the medications are helping.  He does feel anxious at times but it is when he is triggered by things like the examples already discussed.  Denies dizziness, syncope, seizures, numbness, tingling, tremor, tics, unsteady gait, slurred speech, confusion. Denies muscle or joint pain, stiffness, or dystonia.  Individual Medical History/ Review of Systems: Changes? :No    Past medications for mental health diagnoses include: unknown  Allergies: Hepatitis b virus vaccines  Current Medications:  Current Outpatient Medications:  .  cetirizine (ZYRTEC) 5 MG tablet, Take 1 tablet (5 mg total) by mouth daily., Disp: 30 tablet, Rfl: 1 .  doxazosin (CARDURA) 1 MG tablet, Take 1 tablet (1 mg total) by mouth daily., Disp: 30 tablet, Rfl: 5 .  fluticasone (FLONASE) 50 MCG/ACT nasal spray, Place 1 spray into both nostrils daily., Disp: 16 g, Rfl: 2 .  HYDROCHLOROTHIAZIDE PO,  Take 25 mg by mouth daily. , Disp: , Rfl:  .  meloxicam (MOBIC) 7.5 MG tablet, Take 7.5 mg by mouth daily., Disp: , Rfl:  .  METFORMIN HCL PO, Take by mouth., Disp: , Rfl:  .  methocarbamol (ROBAXIN) 500 MG tablet, Take 1 tablet (500 mg total) by mouth 2 (two) times daily., Disp: 20 tablet, Rfl: 0 .  rosuvastatin (CRESTOR) 10 MG tablet, Take 10 mg by mouth daily., Disp: , Rfl:  .  sertraline (ZOLOFT) 100 MG tablet, Take 1 tablet (100 mg  total) by mouth daily., Disp: 30 tablet, Rfl: 5 .  traZODone (DESYREL) 100 MG tablet, Take 1-2 tablets (100-200 mg total) by mouth at bedtime as needed for sleep., Disp: 60 tablet, Rfl: 5 .  azithromycin (ZITHROMAX Z-PAK) 250 MG tablet, Per z pac (Patient not taking: Reported on 06/27/2018), Disp: 1 each, Rfl: 0 .  guaiFENesin (ROBITUSSIN) 100 MG/5ML liquid, Take 5-10 mLs (100-200 mg total) by mouth every 4 (four) hours as needed for cough. (Patient not taking: Reported on 06/27/2018), Disp: 60 mL, Rfl: 0 .  lidocaine (LIDODERM) 5 %, Place 1 patch onto the skin daily. Remove & Discard patch within 12 hours or as directed by MD (Patient not taking: Reported on 06/27/2018), Disp: 30 patch, Rfl: 0 .  predniSONE (DELTASONE) 20 MG tablet, Two daily with food (Patient not taking: Reported on 06/27/2018), Disp: 10 tablet, Rfl: 0 Medication Side Effects: none  Family Medical/ Social History: Changes? Yes see above  MENTAL HEALTH EXAM:  There were no vitals taken for this visit.There is no height or weight on file to calculate BMI.  General Appearance: Unable to assess  Eye Contact:  Unable to assess  Speech:  Clear and Coherent  Volume:  Normal  Mood:  Euthymic  Affect:  Unable to assess  Thought Process:  Goal Directed and Descriptions of Associations: Intact  Orientation:  Full (Time, Place, and Person)  Thought Content: Logical   Suicidal Thoughts:  No  Homicidal Thoughts:  No  Memory:  WNL  Judgement:  Good  Insight:  Good  Psychomotor Activity:  Unable to assess  Concentration:  Concentration: Good  Recall:  Good  Fund of Knowledge: Good  Language: Good  Assets:  Desire for Improvement  ADL's:  Intact  Cognition: WNL  Prognosis:  Good    DIAGNOSES:    ICD-10-CM   1. Episodic mood disorder (HCC)  F39   2. Insomnia, unspecified type  G47.00   3. Generalized anxiety disorder  F41.1   4. PTSD (post-traumatic stress disorder)  F43.10     Receiving Psychotherapy: Yes  Sherron Monday,  [facility]   RECOMMENDATIONS:  Congratulations on the move and retirement! Continue doxazosin 1 mg daily. Continue Zoloft 100 mg p.o. daily. Increase trazodone 100 mg, to 1 or 2 nightly as needed. Sleep hygiene discussed. Continue therapy with Merlyn Albert May,[facility].   Return in 6 weeks.   Melony Overly, PA-C

## 2019-02-09 ENCOUNTER — Encounter: Payer: Self-pay | Admitting: Psychiatry

## 2019-02-09 ENCOUNTER — Ambulatory Visit (INDEPENDENT_AMBULATORY_CARE_PROVIDER_SITE_OTHER): Admitting: Psychiatry

## 2019-02-09 DIAGNOSIS — F411 Generalized anxiety disorder: Secondary | ICD-10-CM | POA: Diagnosis not present

## 2019-02-09 NOTE — Progress Notes (Addendum)
[facility] Counselor/Therapist Progress Note  Patient ID: Zarin Knupp., MRN: [id number],    Date: 02/09/2019  Time Spent: 50 minutes   Treatment Type: Individual Therapy  Reported Symptoms: anxiety, rumination  Mental Status Exam:  Appearance:   Casual     Behavior:  Appropriate  Motor:  Normal  Speech/Language:   Clear and Coherent  Affect:  Appropriate  Mood:  anxious  Thought process:  normal  Thought content:    Rumination  Sensory/Perceptual disturbances:    WNL  Orientation:  oriented to person, place, time/date and situation  Attention:  Good  Concentration:  Good  Memory:  WNL  Fund of knowledge:   Good  Insight:    Good  Judgment:   Good  Impulse Control:  Good   Risk Assessment: Danger to Self:  No Self-injurious Behavior: No Danger to Others: No Duty to Warn:no Physical Aggression / Violence:No  Access to Firearms a concern: No  Gang Involvement:No   Subjective: I connected with this patient by an approved telecommunication method (video), with his informed consent, and verifying identity and patient privacy.  I was located at my office and patient at his home.  As needed, we discussed the limitations, risks, and security and privacy concerns associated with telehealth service, including the availability and conditions which currently govern in-person appointments and the possibility that 3rd-party payment Kamea Dacosta not be fully guaranteed and he Fuquan Wilson be responsible for charges.  After he indicated understanding, we proceeded with the session.  Also discussed treatment planning, as needed, including ongoing verbal agreement with the plan, the opportunity to ask and answer all questions, his demonstrated understanding of instructions, and his readiness to call the office should symptoms worsen or he feels he is in a crisis state and needs more immediate and tangible assistance. The client states that he has been able to successfully use the mindfulness  handout.  He was able to practice that especially over the holidays when he would become stressed about expectations for the future.  I encouraged the client to continue practicing his mindfulness skills. The client states that his thinking has been okay, "as long as I have a plan."  He finds that if he is alone with nothing to do that he begins to ruminate about everything that could happen and the need to have a full-time job.  March 1 is when his paycheck from the [employer] stops.  He has been applying to a number of jobs on a daily basis.  He is also working with a Corporate investment banker to help find work.  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 I discussed with the client that when his thoughts begin to go negative to continue to use mindfulness to stay in the present tense.  I also suggested that he talk to himself in the third person in a compassionate way.  The client states that he does this at home using a white board. He lists out his thoughts and then does pros and cons.  He states it helps him come to a better understanding of himself.  I will supply the client with a handout on the concept of talking to yourself in the third person.  He will practice that between now and the next time I see him.  Interventions: Assertiveness/Communication, Mindfulness Meditation, Motivational Interviewing, Solution-Oriented/Positive Psychology, Psycho-education/Bibliotherapy and Insight-Oriented  Diagnosis:   ICD-10-CM  1. Generalized anxiety disorder  F41.1     Plan: Third person talking, positive self talk, self-care, exercise, mindfulness, boundaries, mood independent behavior, white board.  Lily Velasquez, [facility]

## 2019-03-02 ENCOUNTER — Ambulatory Visit: Admitting: Psychiatry

## 2019-03-09 ENCOUNTER — Ambulatory Visit: Admitting: Psychiatry
